# Patient Record
Sex: Female | Born: 1960 | Race: White | Hispanic: No | Marital: Married | Smoking: Never smoker
Health system: Southern US, Community
[De-identification: ages and names within clinical notes are randomized; demographics above are authoritative.]

## PROBLEM LIST (undated history)

## (undated) DIAGNOSIS — E785 Hyperlipidemia, unspecified: Secondary | ICD-10-CM

## (undated) DIAGNOSIS — I10 Essential (primary) hypertension: Secondary | ICD-10-CM

## (undated) DIAGNOSIS — M1990 Unspecified osteoarthritis, unspecified site: Secondary | ICD-10-CM

## (undated) DIAGNOSIS — T7840XA Allergy, unspecified, initial encounter: Secondary | ICD-10-CM

## (undated) DIAGNOSIS — E041 Nontoxic single thyroid nodule: Secondary | ICD-10-CM

## (undated) DIAGNOSIS — C801 Malignant (primary) neoplasm, unspecified: Secondary | ICD-10-CM

## (undated) DIAGNOSIS — R1084 Generalized abdominal pain: Secondary | ICD-10-CM

## (undated) HISTORY — PX: CYSTECTOMY: SUR359

## (undated) HISTORY — DX: Essential (primary) hypertension: I10

## (undated) HISTORY — DX: Hyperlipidemia, unspecified: E78.5

## (undated) HISTORY — DX: Nontoxic single thyroid nodule: E04.1

## (undated) HISTORY — DX: Allergy, unspecified, initial encounter: T78.40XA

## (undated) HISTORY — DX: Generalized abdominal pain: R10.84

## (undated) HISTORY — DX: Unspecified osteoarthritis, unspecified site: M19.90

## (undated) HISTORY — DX: Malignant (primary) neoplasm, unspecified: C80.1

---

## 1998-09-25 ENCOUNTER — Other Ambulatory Visit: Admission: RE | Admit: 1998-09-25 | Discharge: 1998-09-25 | Payer: Self-pay | Admitting: Family Medicine

## 1999-09-15 ENCOUNTER — Other Ambulatory Visit: Admission: RE | Admit: 1999-09-15 | Discharge: 1999-09-15 | Payer: Self-pay | Admitting: Family Medicine

## 1999-10-06 ENCOUNTER — Other Ambulatory Visit: Admission: RE | Admit: 1999-10-06 | Discharge: 1999-10-06 | Payer: Self-pay | Admitting: Obstetrics and Gynecology

## 1999-10-06 ENCOUNTER — Encounter (INDEPENDENT_AMBULATORY_CARE_PROVIDER_SITE_OTHER): Payer: Self-pay | Admitting: Specialist

## 2000-06-12 ENCOUNTER — Encounter: Admission: RE | Admit: 2000-06-12 | Discharge: 2000-06-12 | Payer: Self-pay | Admitting: Orthopedic Surgery

## 2000-06-12 ENCOUNTER — Encounter: Payer: Self-pay | Admitting: Orthopedic Surgery

## 2000-10-25 ENCOUNTER — Encounter: Payer: Self-pay | Admitting: Family Medicine

## 2000-10-25 ENCOUNTER — Encounter: Admission: RE | Admit: 2000-10-25 | Discharge: 2000-10-25 | Payer: Self-pay | Admitting: Family Medicine

## 2001-11-06 ENCOUNTER — Encounter: Payer: Self-pay | Admitting: Family Medicine

## 2001-11-06 ENCOUNTER — Encounter: Admission: RE | Admit: 2001-11-06 | Discharge: 2001-11-06 | Payer: Self-pay | Admitting: Family Medicine

## 2002-01-30 ENCOUNTER — Encounter: Payer: Self-pay | Admitting: Family Medicine

## 2002-01-30 ENCOUNTER — Encounter: Admission: RE | Admit: 2002-01-30 | Discharge: 2002-01-30 | Payer: Self-pay | Admitting: Family Medicine

## 2003-01-17 ENCOUNTER — Other Ambulatory Visit: Admission: RE | Admit: 2003-01-17 | Discharge: 2003-01-17 | Payer: Self-pay | Admitting: Family Medicine

## 2003-01-21 ENCOUNTER — Encounter: Admission: RE | Admit: 2003-01-21 | Discharge: 2003-01-21 | Payer: Self-pay | Admitting: Family Medicine

## 2005-11-15 ENCOUNTER — Other Ambulatory Visit: Admission: RE | Admit: 2005-11-15 | Discharge: 2005-11-15 | Payer: Self-pay | Admitting: Obstetrics and Gynecology

## 2006-01-17 HISTORY — PX: OTHER SURGICAL HISTORY: SHX169

## 2006-06-20 ENCOUNTER — Ambulatory Visit: Payer: Self-pay | Admitting: Internal Medicine

## 2006-06-20 DIAGNOSIS — L259 Unspecified contact dermatitis, unspecified cause: Secondary | ICD-10-CM | POA: Insufficient documentation

## 2006-07-04 ENCOUNTER — Ambulatory Visit: Payer: Self-pay | Admitting: Internal Medicine

## 2006-07-05 ENCOUNTER — Encounter: Payer: Self-pay | Admitting: Internal Medicine

## 2006-07-20 ENCOUNTER — Ambulatory Visit: Payer: Self-pay | Admitting: Internal Medicine

## 2006-07-20 DIAGNOSIS — I10 Essential (primary) hypertension: Secondary | ICD-10-CM | POA: Insufficient documentation

## 2006-08-01 ENCOUNTER — Encounter: Payer: Self-pay | Admitting: Internal Medicine

## 2006-08-28 ENCOUNTER — Encounter (INDEPENDENT_AMBULATORY_CARE_PROVIDER_SITE_OTHER): Payer: Self-pay | Admitting: *Deleted

## 2006-10-12 ENCOUNTER — Encounter (INDEPENDENT_AMBULATORY_CARE_PROVIDER_SITE_OTHER): Payer: Self-pay | Admitting: Obstetrics and Gynecology

## 2006-10-12 ENCOUNTER — Ambulatory Visit (HOSPITAL_COMMUNITY): Admission: RE | Admit: 2006-10-12 | Discharge: 2006-10-12 | Payer: Self-pay | Admitting: Obstetrics and Gynecology

## 2006-10-13 ENCOUNTER — Encounter: Payer: Self-pay | Admitting: Internal Medicine

## 2006-11-18 HISTORY — PX: CHOLECYSTECTOMY: SHX55

## 2006-11-23 ENCOUNTER — Encounter (INDEPENDENT_AMBULATORY_CARE_PROVIDER_SITE_OTHER): Payer: Self-pay | Admitting: Surgery

## 2006-11-23 ENCOUNTER — Ambulatory Visit (HOSPITAL_COMMUNITY): Admission: EM | Admit: 2006-11-23 | Discharge: 2006-11-24 | Payer: Self-pay | Admitting: Emergency Medicine

## 2006-12-13 ENCOUNTER — Ambulatory Visit: Payer: Self-pay | Admitting: Internal Medicine

## 2006-12-13 LAB — CONVERTED CEMR LAB
Bilirubin Urine: NEGATIVE
Glucose, Urine, Semiquant: NEGATIVE
Ketones, urine, test strip: NEGATIVE
Nitrite: NEGATIVE
RBC / HPF: NONE SEEN (ref <3)
Urobilinogen, UA: NEGATIVE
WBC, UA: NONE SEEN cells/hpf (ref <3)

## 2006-12-21 ENCOUNTER — Ambulatory Visit: Payer: Self-pay | Admitting: Internal Medicine

## 2006-12-25 LAB — CONVERTED CEMR LAB
Basophils Relative: 0.2 % (ref 0.0–1.0)
CO2: 28 meq/L (ref 19–32)
Creatinine, Ser: 0.8 mg/dL (ref 0.4–1.2)
Eosinophils Absolute: 0.3 10*3/uL (ref 0.0–0.6)
Eosinophils Relative: 3.9 % (ref 0.0–5.0)
Glucose, Bld: 87 mg/dL (ref 70–99)
HCT: 39.7 % (ref 36.0–46.0)
HDL: 47.7 mg/dL (ref >39.0)
Lymphocytes Relative: 35 % (ref 12.0–46.0)
MCHC: 33.5 g/dL (ref 30.0–36.0)
Monocytes Absolute: 0.5 10*3/uL (ref 0.2–0.7)
Neutro Abs: 3.4 10*3/uL (ref 1.4–7.7)
Neutrophils Relative %: 53.7 % (ref 43.0–77.0)
Platelets: 353 10*3/uL (ref 150–400)
Potassium: 4.6 meq/L (ref 3.5–5.1)
RBC: 4.47 M/uL (ref 3.87–5.11)
RDW: 13.6 % (ref 11.5–14.6)
Sodium: 136 meq/L (ref 135–145)

## 2007-01-07 ENCOUNTER — Encounter: Payer: Self-pay | Admitting: Internal Medicine

## 2007-03-05 ENCOUNTER — Ambulatory Visit: Payer: Self-pay | Admitting: Internal Medicine

## 2007-03-08 ENCOUNTER — Telehealth (INDEPENDENT_AMBULATORY_CARE_PROVIDER_SITE_OTHER): Payer: Self-pay | Admitting: *Deleted

## 2007-04-11 ENCOUNTER — Telehealth (INDEPENDENT_AMBULATORY_CARE_PROVIDER_SITE_OTHER): Payer: Self-pay | Admitting: *Deleted

## 2007-07-13 ENCOUNTER — Ambulatory Visit: Payer: Self-pay | Admitting: Internal Medicine

## 2007-08-02 ENCOUNTER — Encounter: Payer: Self-pay | Admitting: Internal Medicine

## 2007-08-16 ENCOUNTER — Other Ambulatory Visit: Admission: RE | Admit: 2007-08-16 | Discharge: 2007-08-16 | Payer: Self-pay | Admitting: Obstetrics and Gynecology

## 2007-08-30 ENCOUNTER — Encounter (INDEPENDENT_AMBULATORY_CARE_PROVIDER_SITE_OTHER): Payer: Self-pay | Admitting: *Deleted

## 2007-09-18 LAB — CONVERTED CEMR LAB: Pap Smear: NORMAL

## 2007-11-13 ENCOUNTER — Ambulatory Visit: Payer: Self-pay | Admitting: Internal Medicine

## 2007-11-13 DIAGNOSIS — E785 Hyperlipidemia, unspecified: Secondary | ICD-10-CM

## 2007-11-13 DIAGNOSIS — R109 Unspecified abdominal pain: Secondary | ICD-10-CM | POA: Insufficient documentation

## 2007-11-13 LAB — CONVERTED CEMR LAB
Glucose, Urine, Semiquant: NEGATIVE
Urobilinogen, UA: 0.2

## 2007-11-14 ENCOUNTER — Encounter: Payer: Self-pay | Admitting: Internal Medicine

## 2007-11-14 ENCOUNTER — Encounter (INDEPENDENT_AMBULATORY_CARE_PROVIDER_SITE_OTHER): Payer: Self-pay | Admitting: *Deleted

## 2007-11-14 LAB — CONVERTED CEMR LAB

## 2007-11-16 ENCOUNTER — Telehealth (INDEPENDENT_AMBULATORY_CARE_PROVIDER_SITE_OTHER): Payer: Self-pay | Admitting: *Deleted

## 2007-11-20 ENCOUNTER — Encounter (INDEPENDENT_AMBULATORY_CARE_PROVIDER_SITE_OTHER): Payer: Self-pay | Admitting: *Deleted

## 2007-11-20 LAB — CONVERTED CEMR LAB
Albumin: 4 g/dL (ref 3.5–5.2)
Alkaline Phosphatase: 62 units/L (ref 39–117)
Basophils Relative: 0.7 % (ref 0.0–3.0)
Bilirubin, Direct: 0.1 mg/dL (ref 0.0–0.3)
Eosinophils Relative: 2.9 % (ref 0.0–5.0)
HCT: 39.7 % (ref 36.0–46.0)
Hemoglobin: 13.7 g/dL (ref 12.0–15.0)
Lymphocytes Relative: 32.8 % (ref 12.0–46.0)
Neutro Abs: 4.7 10*3/uL (ref 1.4–7.7)
Neutrophils Relative %: 57 % (ref 43.0–77.0)
Platelets: 293 10*3/uL (ref 150–400)
RBC: 4.38 M/uL (ref 3.87–5.11)
Total Bilirubin: 0.7 mg/dL (ref 0.3–1.2)
Total CHOL/HDL Ratio: 5.6
Triglycerides: 208 mg/dL (ref 0–149)

## 2007-12-05 ENCOUNTER — Ambulatory Visit: Payer: Self-pay | Admitting: Gastroenterology

## 2007-12-06 ENCOUNTER — Telehealth: Payer: Self-pay | Admitting: Gastroenterology

## 2008-04-11 ENCOUNTER — Ambulatory Visit: Payer: Self-pay | Admitting: Gastroenterology

## 2008-04-17 ENCOUNTER — Telehealth: Payer: Self-pay | Admitting: Internal Medicine

## 2008-04-17 ENCOUNTER — Telehealth: Payer: Self-pay | Admitting: Gastroenterology

## 2008-04-25 ENCOUNTER — Ambulatory Visit: Payer: Self-pay | Admitting: Gastroenterology

## 2008-04-25 LAB — HM COLONOSCOPY

## 2008-05-12 ENCOUNTER — Ambulatory Visit: Payer: Self-pay | Admitting: Internal Medicine

## 2008-05-15 ENCOUNTER — Encounter: Admission: RE | Admit: 2008-05-15 | Discharge: 2008-05-15 | Payer: Self-pay | Admitting: Internal Medicine

## 2008-05-20 ENCOUNTER — Telehealth (INDEPENDENT_AMBULATORY_CARE_PROVIDER_SITE_OTHER): Payer: Self-pay | Admitting: *Deleted

## 2008-08-04 ENCOUNTER — Encounter: Payer: Self-pay | Admitting: Internal Medicine

## 2008-08-13 ENCOUNTER — Encounter: Payer: Self-pay | Admitting: Internal Medicine

## 2008-09-03 ENCOUNTER — Encounter (INDEPENDENT_AMBULATORY_CARE_PROVIDER_SITE_OTHER): Payer: Self-pay | Admitting: *Deleted

## 2008-11-03 ENCOUNTER — Telehealth (INDEPENDENT_AMBULATORY_CARE_PROVIDER_SITE_OTHER): Payer: Self-pay | Admitting: *Deleted

## 2009-01-12 ENCOUNTER — Ambulatory Visit: Payer: Self-pay | Admitting: Internal Medicine

## 2009-01-22 ENCOUNTER — Ambulatory Visit: Payer: Self-pay | Admitting: Internal Medicine

## 2009-01-22 LAB — CONVERTED CEMR LAB: Vit D, 25-Hydroxy: 34 ng/mL (ref 30–89)

## 2009-01-26 LAB — CONVERTED CEMR LAB
ALT: 29 units/L (ref 0–35)
Calcium: 9.1 mg/dL (ref 8.4–10.5)
Creatinine, Ser: 0.7 mg/dL (ref 0.4–1.2)
Eosinophils Absolute: 0.3 10*3/uL (ref 0.0–0.7)
Eosinophils Relative: 4.3 % (ref 0.0–5.0)
HCT: 42.5 % (ref 36.0–46.0)
Hemoglobin: 14 g/dL (ref 12.0–15.0)
Lymphocytes Relative: 40.8 % (ref 12.0–46.0)
Lymphs Abs: 2.8 10*3/uL (ref 0.7–4.0)
MCHC: 33 g/dL (ref 30.0–36.0)
MCV: 92.5 fL (ref 78.0–100.0)
Monocytes Absolute: 0.4 10*3/uL (ref 0.1–1.0)
RBC: 4.59 M/uL (ref 3.87–5.11)
RDW: 12.7 % (ref 11.5–14.6)
TSH: 1.53 microintl units/mL (ref 0.35–5.50)
WBC: 6.8 10*3/uL (ref 4.5–10.5)

## 2009-02-12 ENCOUNTER — Telehealth: Payer: Self-pay | Admitting: Internal Medicine

## 2009-03-23 ENCOUNTER — Ambulatory Visit: Payer: Self-pay | Admitting: Internal Medicine

## 2009-03-27 ENCOUNTER — Telehealth: Payer: Self-pay | Admitting: Internal Medicine

## 2009-03-27 LAB — CONVERTED CEMR LAB
ALT: 24 U/L
AST: 21 U/L
Cholesterol: 232 mg/dL — ABNORMAL HIGH
Direct LDL: 155.1 mg/dL
HDL: 46.5 mg/dL
Total CHOL/HDL Ratio: 5
Triglycerides: 222 mg/dL — ABNORMAL HIGH
VLDL: 44.4 mg/dL — ABNORMAL HIGH

## 2009-04-20 IMAGING — CR DG CHEST 1V PORT
1 series · 1 of 1 positions shown · non-contrast
Comparison: none

CLINICAL DATA: Abdominal pain. 
 PORTABLE CHEST - 1 VIEW:

[view not recorded]
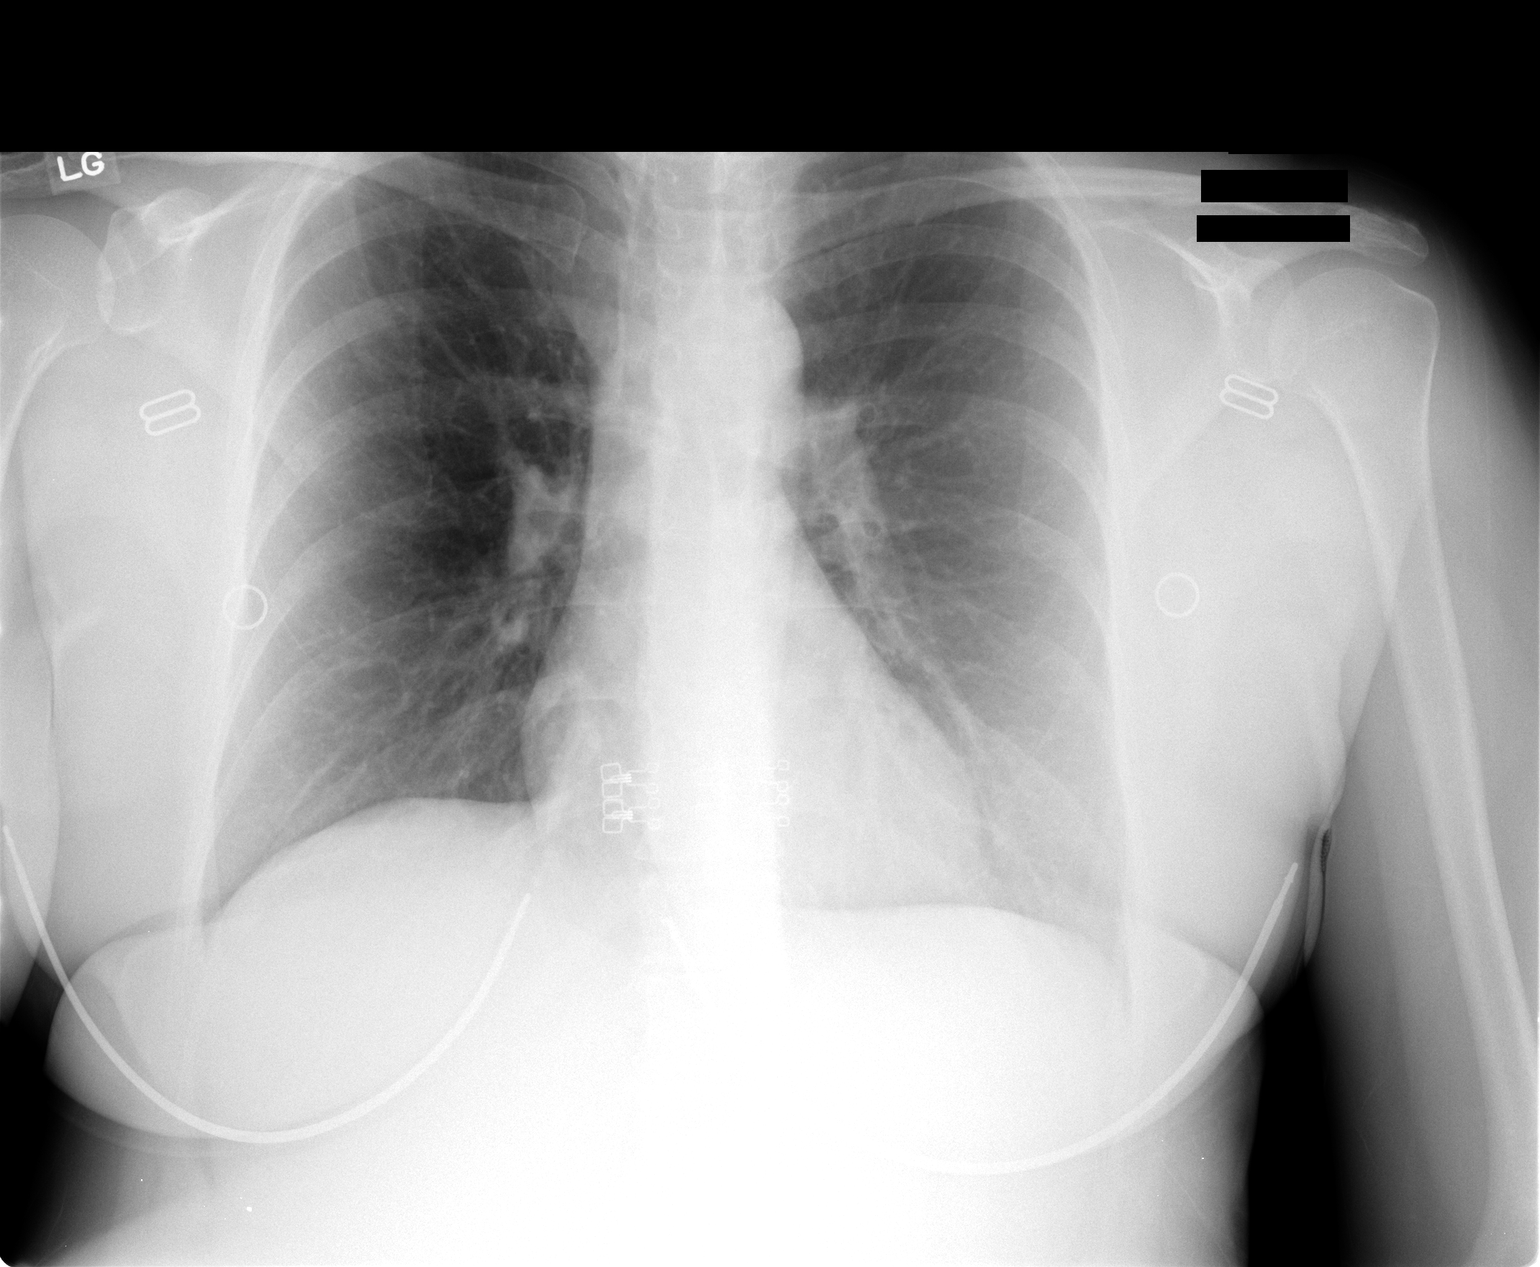

[1 of 1 positions shown; findings below may reference images not displayed]

FINDINGS: Cardiomediastinal silhouette is unremarkable.  Mild peribronchial thickening noted without focal air space disease, pleural effusions, or pneumothorax.  Bony thorax is within normal limits.
IMPRESSION: No evidence of acute cardiopulmonary disease.

## 2009-04-20 IMAGING — US US ABDOMEN COMPLETE
1 series · 13 of 25 positions shown · non-contrast
Comparison: None.

ABDOMEN ULTRASOUND:

CLINICAL DATA: Right upper quadrant pain. Leukocytosis.
TECHNIQUE: Complete abdominal ultrasound examination was performed including
evaluation of the liver, gallbladder, bile ducts, pancreas, kidneys, spleen,
IVC, and abdominal aorta.

[Series 1: unknown · 0.30mm/px · 13 of 75 slices shown]
[im 1/75]
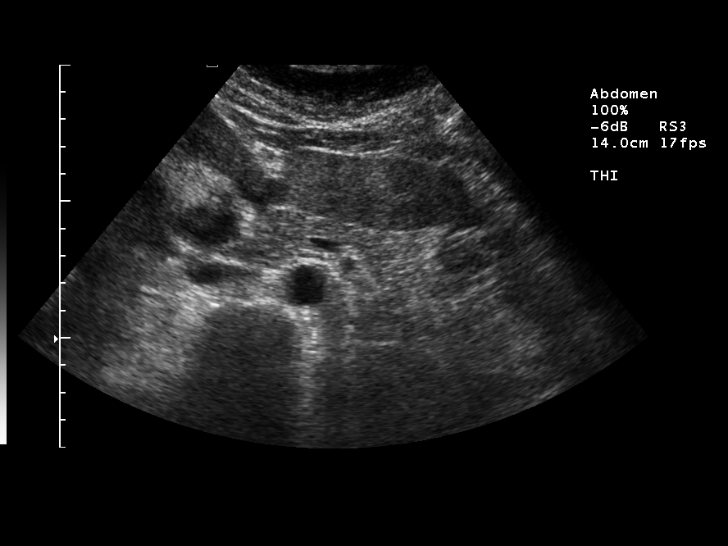
[im 7/75]
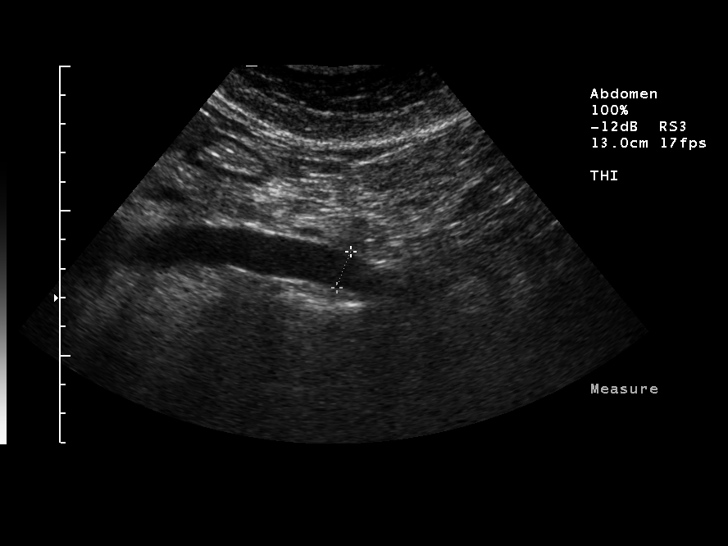
[im 13/75]
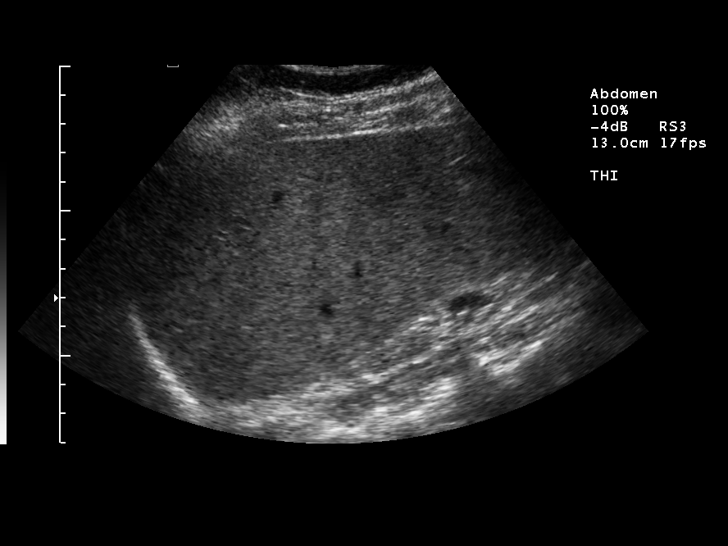
[im 19/75]
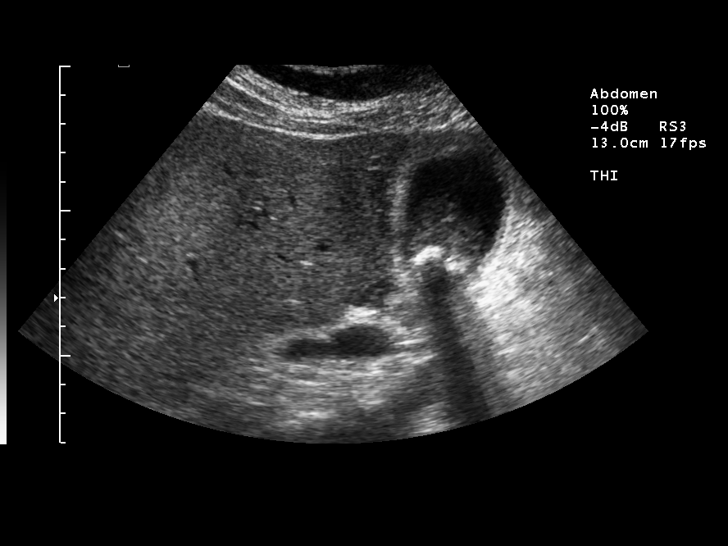
[im 25/75]
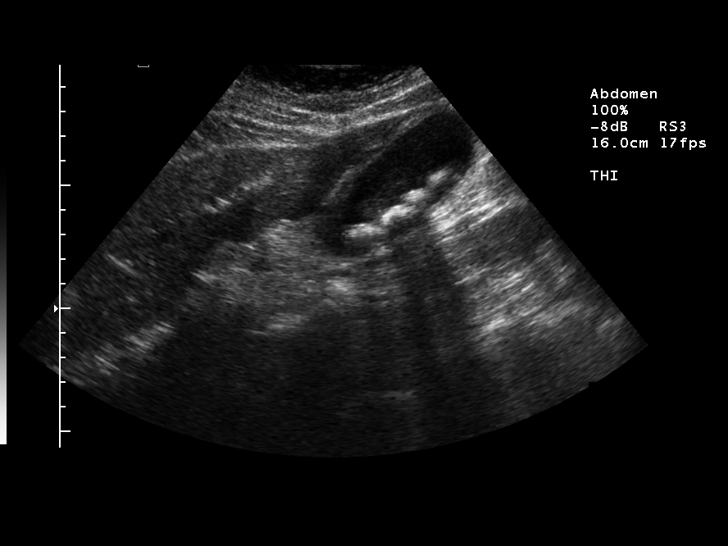
[im 31/75]
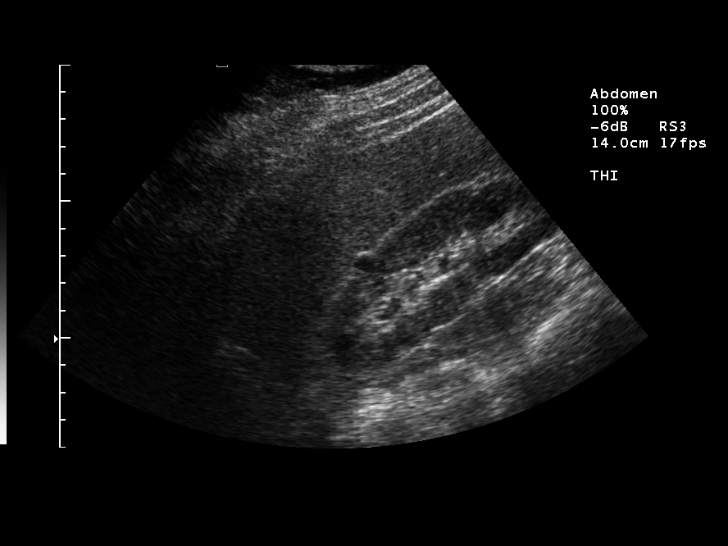
[im 38/75]
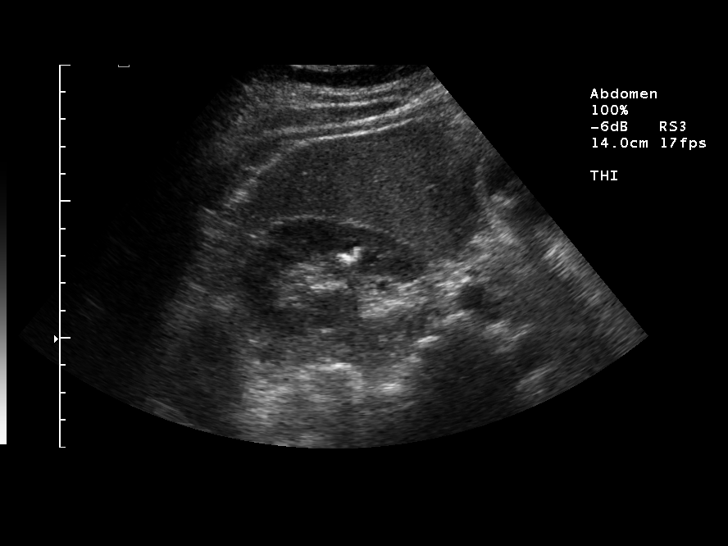
[im 44/75]
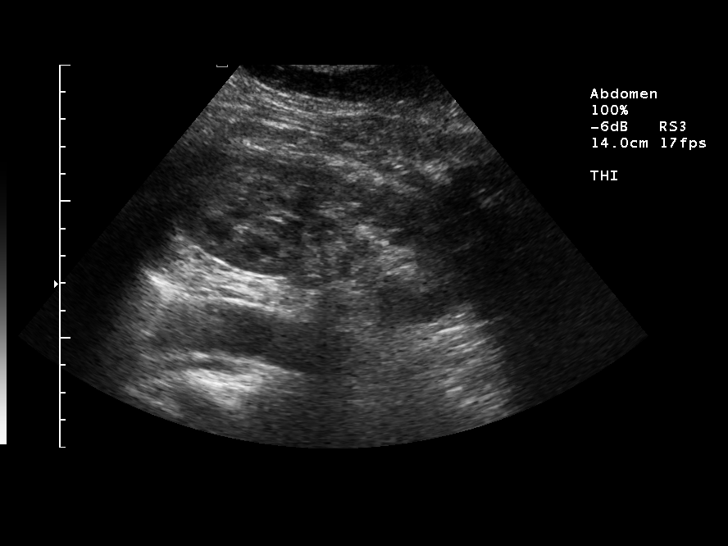
[im 50/75]
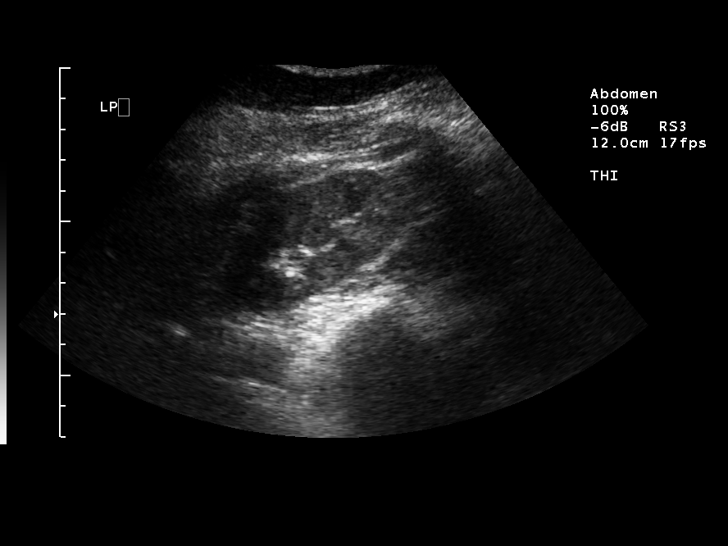
[im 56/75]
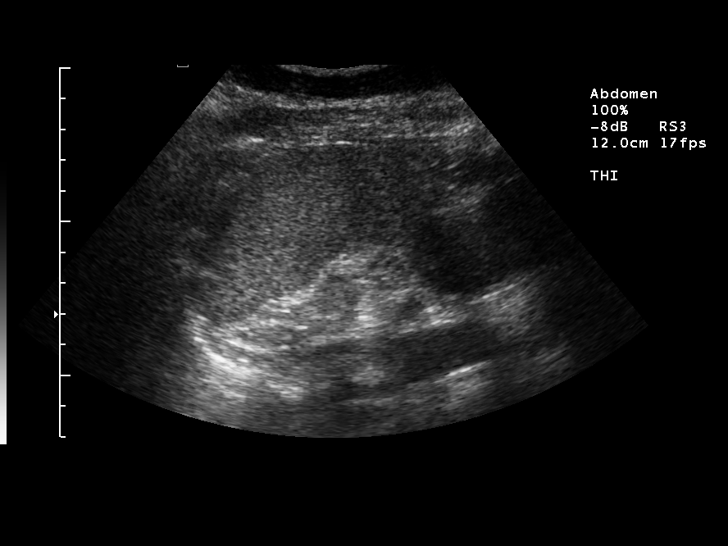
[im 62/75]
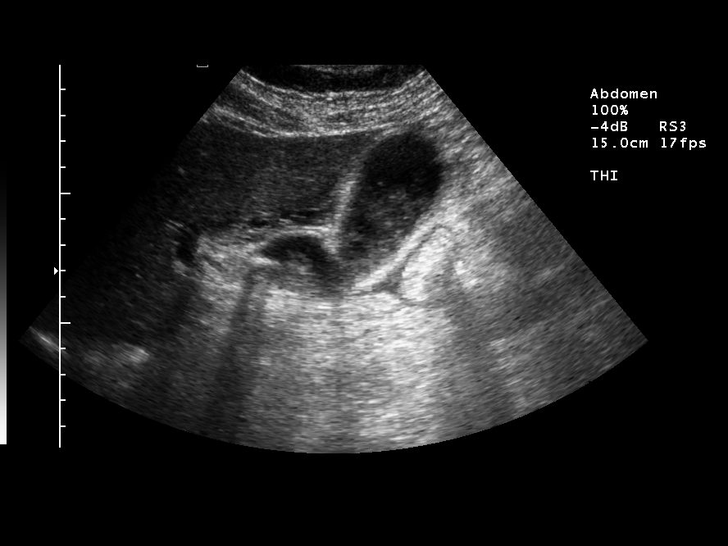
[im 68/75]
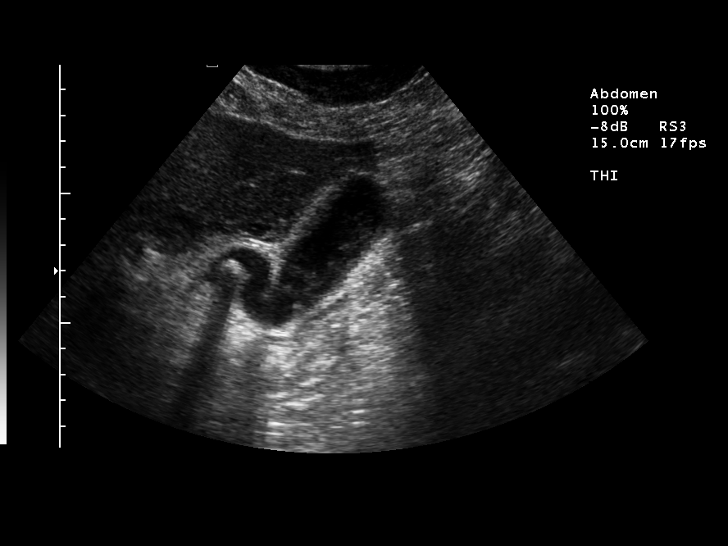
[im 75/75]
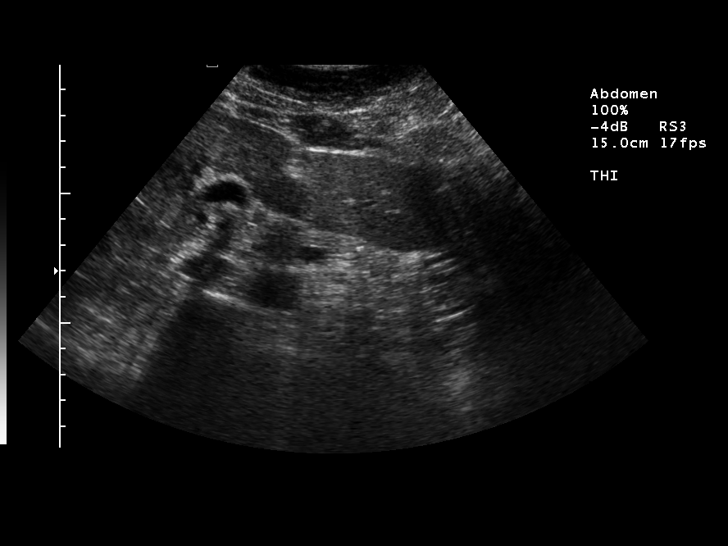

[13 of 25 positions shown; findings below may reference images not displayed]

FINDINGS: Gallbladder:        Multiple calcified stones are seen within the gallbladder
lumen. The gallbladder wall is thickened, measuring 4-5 mm in thickness. There
is a small amount of pericholecystic fluid around the neck the gallbladder. A
stone is identified in the neck of the gallbladder, possibly in the cystic duct.
The patient does have right upper quadrant tenderness pleurodesis technologist
although, apparently, the patient received premedication in the emergency
department before this exam which would lessen the sensitivity for a sonographic
Murphy sign.

Common Bile Duct:   Non-dilated 5 X 6 mm in diameter.

Liver:   Unremarkable. No intrahepatic biliary dilatation.

Inferior Vena Cava:  Normal

Pancreas:  Normal

Spleen:  Normal

Right Kidney:   10.9 cm in long axis.    7 mm echogenic area in the interpolar
region may be a nonobstructing stone. A 7 mm anechoic cortical lesion is
probably a tiny cyst.

Left Kidney:   11.8 cm in long axis.   Question 6 mm nonobstructing stone.

Aorta:  No aneurysm
IMPRESSION: Multiple gallstones with gallbladder wall thickening and pericholecystic
fluid. Imaging features are suspicious for acute cholecystitis. Close clinical
correlation is recommended. There is a stone in the neck of the gallbladder,
possibly just into the cystic duct.

## 2009-07-17 LAB — HM PAP SMEAR: HM Pap smear: NORMAL

## 2009-07-28 ENCOUNTER — Ambulatory Visit: Payer: Self-pay | Admitting: Internal Medicine

## 2009-08-06 ENCOUNTER — Encounter (INDEPENDENT_AMBULATORY_CARE_PROVIDER_SITE_OTHER): Payer: Self-pay | Admitting: *Deleted

## 2009-08-07 LAB — CONVERTED CEMR LAB
Cholesterol: 143 mg/dL (ref 0–200)
Triglycerides: 133 mg/dL (ref 0.0–149.0)
VLDL: 26.6 mg/dL (ref 0.0–40.0)

## 2009-08-10 ENCOUNTER — Encounter: Payer: Self-pay | Admitting: Internal Medicine

## 2009-09-07 ENCOUNTER — Other Ambulatory Visit: Admission: RE | Admit: 2009-09-07 | Discharge: 2009-09-07 | Payer: Self-pay | Admitting: Obstetrics and Gynecology

## 2010-02-16 NOTE — Letter (Signed)
Summary: Unable To Reach-Consult Scheduled  [facility]  [address] [place], [place] [address]   Phone: [phone]  Fax: [phone]    08/06/2009 MRN: [id number]    Dear Ms. Altemose,   We have been unable to reach you by phone.  Please contact our office with an updated phone number.      Thank you,  Army Fossa CMA  August 06, 2009 11:10 AM

## 2010-02-16 NOTE — Progress Notes (Signed)
Summary: ?wt  Phone Note Call from Patient Call back at Home Phone [phone]   Details for Reason: PATIENT LEFT MESSAGE ON VM: Summary of Call: She has started a TOPPS program (wt loss).  Needs to have written documentation of her goal wt that her MD recommends. Initial call taken by: Shary Decamp,  February 12, 2009 1:27 PM  Follow-up for Phone Call        her BM is not too high (26.5) , if she drops to 130 pounds gradually (over 8 months at least) she will be much better w/  calculated BMI of aprox 24 to 25 Cheyenne E. Paz MD  February 12, 2009 5:20 PM   copy up front for pt to pick up [facility]  February 13, 2009 10:35 AM

## 2010-02-16 NOTE — Progress Notes (Signed)
Summary: never started  lipitor 20--- start  Phone Note Outgoing Call   Call placed by: Doristine Devoid,  March 27, 2009 8:25 AM Call placed to: Patient Summary of Call: advise patient: chol is essentially unchanged , rec increase lipitor to 40 FLP AST ALT 2 months  Follow-up for Phone Call        left message on machine ....Marland KitchenMarland KitchenDoristine Devoid  March 27, 2009 8:25 AM  discussed labs with pt -- pt never took the lipitor 20mg , new rx for 20mg  called to pharmacy -- pt advised to recheck labs in 2 months Shary Decamp  April 02, 2009 11:45 AM     New/Updated Medications: LIPITOR 40 MG TABS (ATORVASTATIN CALCIUM) take one tablet at bedtime LIPITOR 20 MG TABS (ATORVASTATIN CALCIUM) 1 by mouth at bedtime - [facility] 5/11 Prescriptions: LIPITOR 20 MG TABS (ATORVASTATIN CALCIUM) 1 by mouth at bedtime - [facility] 5/11  #30 x 2   Entered by:   Shary Decamp   Authorized by:   Nolon Rod. Timera Windt MD   Signed by:   Shary Decamp on 04/02/2009   Method used:   Electronically to        [id number] (retail)       [address]       [place], [place]  [address]       Ph: [phone]       Fax: [phone]   RxID:   [id number]

## 2010-04-12 ENCOUNTER — Telehealth: Payer: Self-pay | Admitting: Internal Medicine

## 2010-04-12 MED ORDER — METOPROLOL TARTRATE 25 MG PO TABS: 25.0000 mg | ORAL_TABLET | Freq: Two times a day (BID) | ORAL | Status: DC

## 2010-04-12 NOTE — Telephone Encounter (Signed)
Please call the patient, okay to call one month supply, no further refills without a office visit, last office visit was in December 2010.

## 2010-04-12 NOTE — Telephone Encounter (Signed)
Left message for pt to call back- due for appt.

## 2010-04-12 NOTE — Telephone Encounter (Signed)
Spoke w/ pt aware of medication and sent to pharmacy

## 2010-05-13 ENCOUNTER — Encounter: Payer: Self-pay | Admitting: Internal Medicine

## 2010-05-14 ENCOUNTER — Ambulatory Visit (INDEPENDENT_AMBULATORY_CARE_PROVIDER_SITE_OTHER): Payer: BC Managed Care – PPO | Admitting: Internal Medicine

## 2010-05-14 ENCOUNTER — Encounter: Payer: Self-pay | Admitting: Internal Medicine

## 2010-05-14 DIAGNOSIS — Z0000 Encounter for general adult medical examination without abnormal findings: Secondary | ICD-10-CM | POA: Insufficient documentation

## 2010-05-14 DIAGNOSIS — E785 Hyperlipidemia, unspecified: Secondary | ICD-10-CM

## 2010-05-14 DIAGNOSIS — M722 Plantar fascial fibromatosis: Secondary | ICD-10-CM | POA: Insufficient documentation

## 2010-05-14 DIAGNOSIS — K589 Irritable bowel syndrome without diarrhea: Secondary | ICD-10-CM | POA: Insufficient documentation

## 2010-05-14 DIAGNOSIS — I10 Essential (primary) hypertension: Secondary | ICD-10-CM

## 2010-05-14 MED ORDER — DICLOFENAC SODIUM 1 % TD GEL: 1.0000 "application " | Freq: Three times a day (TID) | TRANSDERMAL | Status: DC

## 2010-05-14 NOTE — Progress Notes (Signed)
  Subjective:    Patient ID: Cheyenne Bullock, female    DOB: [birth year], 51 y.o.   MRN: [id number]  HPI CPX Last of his visit December 2010. She has lists of concerns -plantar fasceitis: local NSAIDs? -Complaining of difficulty sleeping, wakes up a lot, often times wakes up with tingling in all her fingertips. -She has a history of IBS, she had regular bowel movements daily however she developed pain in the right lower quadrant and epigastric pain after every bowel movement. Pain is described as a hurt, not crampy, usually last several hours. -Also concerned about central obesity, wonders about a "syndrome W." request a insulin level -Also she is unable to lose weight despite a reasonable diet Additionally, we need to check her chronic or medical problems. -High cholesterol, good medication compliance. -Hypertension, good medication compliance, normal ambulatory blood pressures.  Review of Systems No nausea, vomiting, diarrhea. No blood in the stools Denies chest pain, shortness of breath or lower extremity edema Admits to decreased libido and vaginal dryness (asked patient to discuss with gynecology)    Objective:   Physical Exam  Constitutional: She is oriented to person, place, and time. She appears well-developed and well-nourished. No distress.  HENT:  Head: Normocephalic and atraumatic.  Eyes: No scleral icterus.  Neck: No thyromegaly present.       Normal carotid pulses  Cardiovascular: Normal rate, regular rhythm and normal heart sounds.   No murmur heard. Pulmonary/Chest: Effort normal and breath sounds normal. No respiratory distress. She has no wheezes. She has no rales.  Abdominal: Soft. Bowel sounds are normal. She exhibits no distension and no mass. There is no tenderness. There is no rebound and no guarding.       No organomegaly  Musculoskeletal: She exhibits no edema.  Neurological: She is alert and oriented to person, place, and time. No cranial nerve deficit.  Coordination normal.  Skin: Skin is warm and dry.  Psychiatric: She has a normal mood and affect. Her behavior is normal. Judgment and thought content normal.          Assessment & Plan:

## 2010-05-14 NOTE — Assessment & Plan Note (Addendum)
Td 12-2008 sees  Gynecology regularly normal colonoscopy April 2010 Plan exercise discussed Paresthesias, we'll check a B12 and folic acid.

## 2010-05-14 NOTE — Patient Instructions (Signed)
Please come back fasting FLP-- hyperlipidemia BMP--- dx HTN CBC, TSH, AST, ALT, insulin level---dx V70 B12-folic acid--- dx paresthesias

## 2010-05-14 NOTE — Assessment & Plan Note (Signed)
Well-controlled, EKG within normal limits. RF medicines when needed

## 2010-05-14 NOTE — Assessment & Plan Note (Signed)
Long-standing problem per patient, has seemed or phobia for broken injections. In addition to stretching, I am prescribing Voltaren gel

## 2010-05-14 NOTE — Assessment & Plan Note (Signed)
Labs

## 2010-05-14 NOTE — Assessment & Plan Note (Signed)
Chronic symptoms, recommend to discuss with GI

## 2010-05-17 ENCOUNTER — Other Ambulatory Visit (INDEPENDENT_AMBULATORY_CARE_PROVIDER_SITE_OTHER): Payer: BC Managed Care – PPO

## 2010-05-17 ENCOUNTER — Other Ambulatory Visit: Payer: Self-pay | Admitting: Internal Medicine

## 2010-05-17 DIAGNOSIS — I10 Essential (primary) hypertension: Secondary | ICD-10-CM

## 2010-05-17 DIAGNOSIS — R209 Unspecified disturbances of skin sensation: Secondary | ICD-10-CM

## 2010-05-17 DIAGNOSIS — Z0000 Encounter for general adult medical examination without abnormal findings: Secondary | ICD-10-CM

## 2010-05-17 LAB — CBC WITH DIFFERENTIAL/PLATELET
Basophils Absolute: 0 10*3/uL (ref 0.0–0.1)
Basophils Relative: 0.2 % (ref 0.0–3.0)
Eosinophils Absolute: 0.2 10*3/uL (ref 0.0–0.7)
HCT: 39.2 % (ref 36.0–46.0)
Hemoglobin: 13.5 g/dL (ref 12.0–15.0)
MCHC: 34.4 g/dL (ref 30.0–36.0)
Monocytes Absolute: 0.4 10*3/uL (ref 0.1–1.0)
Monocytes Relative: 6.9 % (ref 3.0–12.0)
RBC: 4.35 Mil/uL (ref 3.87–5.11)
RDW: 13.6 % (ref 11.5–14.6)

## 2010-05-17 LAB — AST: AST: 19 U/L (ref 0–37)

## 2010-05-17 LAB — ALT: ALT: 22 U/L (ref 0–35)

## 2010-05-17 LAB — BASIC METABOLIC PANEL
Creatinine, Ser: 0.9 mg/dL (ref 0.4–1.2)
Potassium: 4.1 mEq/L (ref 3.5–5.1)
Sodium: 136 mEq/L (ref 135–145)

## 2010-05-17 LAB — TSH: TSH: 1.42 u[IU]/mL (ref 0.35–5.50)

## 2010-05-17 LAB — FOLATE: Folate: >24.8 ng/mL (ref >5.9)

## 2010-05-19 ENCOUNTER — Telehealth: Payer: Self-pay | Admitting: *Deleted

## 2010-05-19 LAB — LIPID PANEL
Triglycerides: 256 mg/dL — ABNORMAL HIGH (ref 0.0–149.0)
VLDL: 51.2 mg/dL — ABNORMAL HIGH (ref 0.0–40.0)

## 2010-05-19 LAB — LDL CHOLESTEROL, DIRECT: Direct LDL: 161.3 mg/dL

## 2010-05-19 NOTE — Telephone Encounter (Signed)
Message copied by Army Fossa on Wed May 19, 2010  9:41 AM ------      Message from: Cheyenne Bullock      Created: Tue May 18, 2010  5:39 PM       Advised patient, all her blood work is completely normal including her insulin levels.      Please see instructions from the OV, she was supposed to have FLP. Please be assured that is done

## 2010-05-19 NOTE — Telephone Encounter (Signed)
Message left for patient to return my call.  

## 2010-05-20 ENCOUNTER — Telehealth: Payer: Self-pay | Admitting: *Deleted

## 2010-05-20 NOTE — Telephone Encounter (Signed)
Message copied by Army Fossa on Thu May 20, 2010  9:45 AM ------      Message from: Willow Ora      Created: Thu May 20, 2010  9:06 AM       Advise patient:      His cholesterol used to be a lot better.      If she has been taking Lipitor 20 mg daily, increased to 40 mg daily.      If she has not been taking her Lipitor consistently I recommended her to do so.      Please arrange for a office visit in 4 months

## 2010-05-20 NOTE — Telephone Encounter (Signed)
Message left for patient to return my call.  

## 2010-05-21 NOTE — Telephone Encounter (Signed)
Message left for patient to return my call.  

## 2010-05-24 NOTE — Telephone Encounter (Signed)
Message left for patient to return my call.  

## 2010-05-25 ENCOUNTER — Encounter: Payer: Self-pay | Admitting: *Deleted

## 2010-05-25 NOTE — Telephone Encounter (Signed)
Will mail

## 2010-05-25 NOTE — Telephone Encounter (Signed)
Unable to reach pt, would you like for me to just mail- unable to find out about her liptor?

## 2010-05-25 NOTE — Telephone Encounter (Signed)
Send labs, my notes and a note asking about lipitor dose

## 2010-05-25 NOTE — Telephone Encounter (Signed)
Mailed letter °

## 2010-05-29 ENCOUNTER — Other Ambulatory Visit: Payer: Self-pay | Admitting: Internal Medicine

## 2010-05-31 ENCOUNTER — Telehealth: Payer: Self-pay | Admitting: *Deleted

## 2010-05-31 NOTE — Telephone Encounter (Signed)
Please ask them for any suggestions

## 2010-05-31 NOTE — Telephone Encounter (Signed)
[facility] sent a fax stating that pts Voltarten is on backorder with no release date. Please advise on an alt med.

## 2010-06-01 NOTE — Telephone Encounter (Signed)
Only recommendation for a Gel type is Pennsaid-- need directions.

## 2010-06-01 NOTE — Op Note (Signed)
[name], Cheyenne Bullock              ACCOUNT NO.:  [id number]   MEDICAL RECORD NO.:  [id number]          PATIENT TYPE:  AMB   LOCATION:  SDC                           FACILITY:  [facility]   PHYSICIAN:  Charles A. Delcambre, MDDATE OF BIRTH:  [birth year]   DATE OF PROCEDURE:  10/12/2006  DATE OF DISCHARGE:                               OPERATIVE REPORT   PREOPERATIVE DIAGNOSIS:  1. Menorrhagia.  2. Cervical polyp.   POSTOPERATIVE DIAGNOSIS:  1. Menorrhagia.  2. Cervical polyp.   PROCEDURE:  ThermaChoice endometrial ablation done after dilation and  curettage for endometrial sampling and LEEP of cervical polyp.   SURGEON:  Charles A. Sydnee Cabal, M.D.   ASSISTANT:  None.   COMPLICATIONS:  None.   COMPLICATIONS:  None.   ESTIMATED BLOOD LOSS:  Less than 25 mL.   ANESTHESIA:  General by LMA.   OPERATIVE FINDINGS:  Sounded to 10 cm.   SPECIMENS:  Endometrial curettings to pathology, cervical polyp to  pathology.   COMPLICATIONS:  None.   DESCRIPTION OF PROCEDURE:  The patient was taken to the operating room  and placed in the supine position.  Anesthesia was induced without  difficulty.  She was then placed in the dorsal lithotomy position in  universal stirrups.  Sterile prep and drape was undertaken.  The  anterior lip of the cervix was grasped with a single tooth tenaculum  with a weighted speculum in the vagina and sound was to 10 cm. Dilation,  very slightly, enough to pass a serrated curet was done.  Circumferential curettings were obtained with the serrated curet.  There  was no evidence of perforation.  A small amount of tissue was obtained.  Attention was then turned to the ThermaChoice.  The ThermaChoice was  primed and prepped per protocol and brought to minus 180 mmHg and then  inserted and pressures were stabilized at 175 mmHg. With the pressure  stable, no evidence of perforation, the heating cycle was begun. Heating  up to 87 degrees was accomplished without  difficulty and then the eight  minute therapy cycle was accomplished without complication.  The  ThermaChoice was removed and attention was given to the LEEP.  The LEEP  was done to remove the cervical polyp which was sessile in nature at the  6 to 7 o'clock.  This was done under gross visualization.  The  colposcope was not used because we were not dealing with an abnormal Pap  lesion but rather taking a sessile polyp off of the cervix.  Cutting at  50 watts was used, coag at 50 watts was used, and hemostasis was good.  Upon removal of the speculum, there was some bleeding noted.  Again,  further cautery was done carefully and Monsel's was placed.  Hemostasis  was excellent at the termination of the procedure.  The patient was  awakened and taken to recovery with the physician in attendance, having  tolerated procedure well.      Charles A. Sydnee Cabal, MD  Electronically Signed     CAD/MEDQ  D:  10/12/2006  T:  10/12/2006  Job:  [id number]

## 2010-06-01 NOTE — H&P (Signed)
[name], Cheyenne Bullock              ACCOUNT NO.:  [id number]   MEDICAL RECORD NO.:  [id number]          PATIENT TYPE:  AMB   LOCATION:  SDC                           FACILITY:  [facility]   PHYSICIAN:  Charles A. Delcambre, MDDATE OF BIRTH:  1960-12-11   DATE OF ADMISSION:  DATE OF DISCHARGE:                              HISTORY & PHYSICAL   ADDENDUM:  This patient was admitted tomorrow to undergo LEEP area on  her cervical polyp at 6 o'clock with a sessile base and wide base. She  did not tolerate this in the office. She is also having irregular  bleeding and dysmenorrhea. Pain increases the day or two prior to her  period. This gets somewhat better. She has not had STDs. She has a  period every 21 days, bleeds for about 7 days, 2-day passage of large  clots. Hysterogram was done and was normal. Endometrial thickness was 6  mm. Endometrial biopsy August 12, 2003 was proliferative endometrium. She  desires ablation at this time. We will add the ablation, as she has not  been sampled preoperatively since 2005, we will go ahead and get a  slight D&C specimen with a serrated curet prior to ablation at the  hospital. All questions were answered. She gives informed consent.  Accepts risk of perforation, failed ablation, fluid imbalance, failure  of 10%, eumenorrhea approximately 80-90% and amenorrhea approximately 30-  40% after ThermaChoice ablation. She gives informed consent. She  understands that 1 month of watery discharge after the procedure and  will follow up as directed. We will reschedule the surgery until next  week as she is on her period time to decrease infection risk with the  cervical LEEP.      Charles A. Sydnee Cabal, MD  Electronically Signed     CAD/MEDQ  D:  09/27/2006  T:  09/28/2006  Job:  [id number]

## 2010-06-01 NOTE — Op Note (Signed)
NAMEDAURICE, OVANDO              ACCOUNT NO.:  [id number]   MEDICAL RECORD NO.:  [id number]          PATIENT TYPE:  INP   LOCATION:  1540                         FACILITY:  [facility]   PHYSICIAN:  Thornton Park. Daphine Deutscher, MD  DATE OF BIRTH:  10-03-1960   DATE OF PROCEDURE:  11/23/2006  DATE OF DISCHARGE:                               OPERATIVE REPORT   PREOPERATIVE DIAGNOSIS:  Acute cholecystitis.   POSTOPERATIVE DIAGNOSIS:  Acute cholecystitis with normal intraoperative  cholangiogram.   SURGEON:  Thornton Park. Daphine Deutscher, M.D.   ASSISTANT:  None.   ANESTHESIA:  General endotracheal.   DESCRIPTION OF PROCEDURE:  Kafi Dotter is a 50 year old lady brought  to room 6 on Thursday afternoon, November 23, 2006, and given general  anesthesia.  The abdomen was prepped with Techni-Care and draped  sterilely.   I entered the abdomen through the umbilicus with a longitudinal incision  without difficulty.  Standard trocars were placed in the upper abdomen,  but her gallbladder was really distended and pushed to the midline, and  I placed the upper trocar and had a small superficial laceration of the  left lateral segment which I controlled with electrocautery and which  stopped at that point.  I then grasped the gallbladder and elevated it.  It was swollen,  striated, edematous, and acutely inflamed.  I went  ahead and elevated it and then dissected free Calot's triangle which was  somewhat engorged and edematous, and I put a clip up on the gallbladder  and incised the cystic duct and did a dynamic cholangiogram.  I then  triple clipped the cystic duct and divided it, triple clipped the cystic  artery and divided it, and then removed the gallbladder without entering  it.  I did, however, have a laceration of the top from where it was  grasped, and it decompressed some of the bile, but no stones were  spilled.  The gallbladder was then detached from the gallbladder bed and  placed in a bag and  brought out through the umbilicus with a little bit  of difficulty just because it had so many  stones in it.  The umbilical  port was then repaired with a pursestring suture 0 Vicryl under  laparoscopic vision.  I bovied the gallbladder bed and controlled the  bleeding as I was removing the gallbladder.  There was 1 bleeder along  the lateral wall that was a little arterial pumper that I controlled  with two clips.  No bile  leaks were noted, and everything looked to be in order.  I injected all  ports with Marcaine.  I withdrew the irrigant and decompressed the  abdomen.  I closed it with 4-0 Vicryl, Benzoin, and Steri-Strips.   The patient tolerated the procedure well and was taken to the recovery  room in satisfactory condition.      Thornton Park Daphine Deutscher, MD  Electronically Signed     MBM/MEDQ  D:  11/23/2006  T:  11/24/2006  Job:  [id number]   cc:   Willow Ora, MD  [phone] [address] [place], [place] [address]

## 2010-06-01 NOTE — Telephone Encounter (Signed)
pennsaid 15 drops bid prn pain , 1 month supply and 1 RF

## 2010-06-01 NOTE — H&P (Signed)
Cheyenne Bullock, Cheyenne Bullock              ACCOUNT NO.:  [id number]   MEDICAL RECORD NO.:  [id number]          PATIENT TYPE:  INP   LOCATION:  1540                         FACILITY:  [facility]   PHYSICIAN:  Thornton Park. Daphine Deutscher, MD  DATE OF BIRTH:  10/29/1960   DATE OF ADMISSION:  11/22/2006  DATE OF DISCHARGE:                              HISTORY & PHYSICAL   CHIEF COMPLAINT:  Upper abdominal pain, unrelenting.   HISTORY:  Cheyenne Bullock is a 50 year old white female originally from  [place], who presented to the emergency room on the evening of November 22, 2006, with upper abdominal pain that had been lasting about a day and  a half.  She has had attacks of pain before in the right upper quadrant  but they all went away, but this was persisted.  She was brought to the  emergency room when she bent over to use the bathroom and suddenly the  pain would not go away and seemed to get worse.  She was evaluated by  Dr. Estell Harpin and Dr. Weldon Inches and that evaluation included a gallbladder  ultrasound which demonstrated multiple gallstones with gallbladder wall  thickening and pericholecystic fluid.  I was consulted to evaluate her  and am doing so on November 23, 2006, in the emergency department.   PAST MEDICAL HISTORY:  Remarkable in that she has hypertension treated  with metoprolol 25 mg twice a day.  Her prior surgery includes some  removal of cysts on her ovaries and she had a cesarean section with her  daughter.  Last menstrual period was November 02, 2006.   SOCIAL HISTORY:  She is married.   REVIEW OF SYSTEMS:  Negative for neurologic problems, cardiovascular  problems, GI problems except for the pain that she is having presently,  or other chronic medical problems.   FAMILY HISTORY:  Noncontributory.   PHYSICAL EXAM:  Temperature afebrile, blood pressure 168/102, pulse 96,  respirations 20.  Head:  Normocephalic.  Eyes: Sclerae nonicteric.  Pupils equal, round,  and reactive to light.   Nose and throat exam unremarkable.  CHEST:  Clear to auscultation.  HEART:  Sinus tachycardia.  ABDOMEN:  Still sore in the right upper quadrant despite Toradol given  by the ER nurses.  EXTREMITIES:  Full range of motion.  No cyanosis, edema or clubbing.  SKIN:  No skin lesions noted.  NEUROLOGIC:  Alert and oriented x3.  Motor and sensory function are  grossly intact.   LABORATORY:  Hemoglobin 13.5, white count 15,900 with 78% neutrophils.  Laboratory-wise, she has an elevated glucose and that was really all  that was noted.  Her serum transaminases are not all completely viewed  on the ER record and creatinine is 1.  Urine was negative.   IMPRESSION:  Acute cholecystitis.   I have discussed laparoscopic and open cholecystectomy with her in  detail, including complications not limited to bleeding, infection,  common bile duct injury or bile leaks.  She wants to go ahead and  proceed with surgery and I have gone ahead and scheduled this follow a  case here on this  Thursday, November 23, 2006, and it would either be  done by myself or one of my partners at [facility].      Thornton Park Daphine Deutscher, MD  Electronically Signed     MBM/MEDQ  D:  11/23/2006  T:  11/23/2006  Job:  [id number]   cc:   Willow Ora, MD  [phone] [address] [place], [place] [address]

## 2010-06-01 NOTE — H&P (Signed)
NAMEADALIDA, Cheyenne Bullock              ACCOUNT NO.:  [id number]   MEDICAL RECORD NO.:  [id number]          PATIENT TYPE:  AMB   LOCATION:  [facility]C                           FACILITY:  [facility]   PHYSICIAN:  Charles A. Delcambre, MDDATE OF BIRTH:  03/07/1960   DATE OF ADMISSION:  09/28/2006  DATE OF DISCHARGE:                              HISTORY & PHYSICAL   This patient will be admitted on September 28, 2006 to undergo LEEP  therapy of a 6 o'clock polyp that is friable, causing irregular  bleeding.  She is 50 years old, para 3-0-0-3, who was to have a LEEP in  the office, but did not tolerate the injection of local anesthetic  lidocaine.  For this reason, she will be admitted for a likely general  anesthetic or very heavy MAC to undergo the LEEP therapy.  No lidocaine  or paracervical block.  The patient agrees.  She gives informed consent.  We discussed the risks of infection, bleeding, and damage to surrounding  tissue.  All questions were answered, and we will proceed.   PAST MEDICAL HISTORY:  Ovarian cyst treated surgically.   PAST SURGICAL HISTORY:  Laparoscopic surgery for a cyst or torsion of  the ovary, lemon sized and orange sized bilaterally, in 1988, she  states.   MEDICATIONS:  None.   ALLERGIES:  No known drug allergies.   SOCIAL HISTORY:  No tobacco, ethanol, or drug use.  She is married, in a  monogamous relationship with her husband.  She writes for the paper, a  column.   FAMILY HISTORY:  Hypertension and heart disease in her father, deceased  at age 22.  Mother age 99, alive and well.  Brother deceased at age 20  of testicular cancer.  Sister and brother 55 and 74, respectively, in  good health.  No other major illnesses.   REVIEW OF SYSTEMS:  Some menorrhagia and friability.  Bleeding with  intercourse occasionally.  No chest pain, shortness of breath, wheezing,  or other significant changes.   PHYSICAL EXAMINATION:  GENERAL:  Alert and oriented x3, no distress.  HEENT:  Grossly within normal limits.  LUNGS:  Clear bilaterally.  HEART:  Regular rate and rhythm, without murmur, rub, or gallop.  BREASTS:  No masses, tenderness, or discharge, skin, or nipple changes  bilaterally.  ABDOMEN:  Soft, flat, and nontender.  No hepatosplenomegaly.  PELVIC:  Normal external female genitalia.  Bartholin's, urethra, and  Skene's within normal limits.  Vault without discharge or lesions.  Cervix with a 6 o'clock sessile polyp, friable.  Of note, Pap smears  have been benign, and biopsy of the polyp was benign.  Bimanual  examination:  Uterus not enlarged, adnexa nontender, without masses  bilaterally.  Ovaries palpable and normal in size bilaterally.  VITAL SIGNS:  Blood pressure 138/90, heart rate 80, respirations 16.   A:Sessile , friable bervical polyp.  Intolerant of LEEP trial /  Lidocaine (plain) in the office.   P:All questions were answered, and we will proceed as outlined with the  cervical polyp, symptomatic, and not tolerating LEEP in the office, with  a LEEP procedure in the hospital.  Preop CBC, npo post midnight.  Accepts risks bleeding including HIV and hepatitis, surrounding tissue,  vagina, rectum damage, infection.      Charles A. Sydnee Cabal, MD  Electronically Signed     CAD/MEDQ  D:  08/30/2006  T:  08/31/2006  Job:  [id number]

## 2010-06-02 MED ORDER — DICLOFENAC SODIUM 1.5 % TD SOLN: TRANSDERMAL | Status: DC

## 2010-06-04 NOTE — Assessment & Plan Note (Signed)
[facility]                                 ON-CALL NOTE   ADAIRA, CENTOLA                       MRN:          [id number]  DATE:11/22/2006                            DOB:          1960/03/04    TIME RECEIVED:  8:30 p.m.   CALLER:  Alisyn Lequire.   She sees Dr. Drue Novel.   PHONE NUMBER:  [phone].   The patient has been having constant abdominal pains for the past few  days, but denies they are getting much worse.  At first, it began in the  right flank and the right upper quadrant.  Then, earlier this evening,  it moved to the right lower quadrant, and now extends across the entire  lower abdomen.  It has become quite severe.  She is nauseated, but has  not vomited.  There is no apparent fever.  My advice is that she have  her husband drive her to the emergency room immediately.  Certainly  appendicitis is a possible concern.  I advised her to take nothing by  mouth in the meantime.     Tera Mater. Clent Ridges, MD  Electronically Signed    SAF/MedQ  DD: 11/22/2006  DT: 11/23/2006  Job #: [id number]

## 2010-06-08 ENCOUNTER — Other Ambulatory Visit: Payer: Self-pay | Admitting: Internal Medicine

## 2010-06-16 ENCOUNTER — Telehealth: Payer: Self-pay

## 2010-06-16 NOTE — Telephone Encounter (Signed)
Prior auth received on [place]aid medication has been approved from 05/25/10-09/13/10

## 2010-06-21 ENCOUNTER — Encounter: Payer: Self-pay | Admitting: Gastroenterology

## 2010-06-21 ENCOUNTER — Ambulatory Visit (INDEPENDENT_AMBULATORY_CARE_PROVIDER_SITE_OTHER): Payer: BC Managed Care – PPO | Admitting: Gastroenterology

## 2010-06-21 VITALS — BP 132/76 | HR 68 | Ht 63.0 in | Wt 144.0 lb

## 2010-06-21 DIAGNOSIS — Z800 Family history of malignant neoplasm of digestive organs: Secondary | ICD-10-CM

## 2010-06-21 DIAGNOSIS — K589 Irritable bowel syndrome without diarrhea: Secondary | ICD-10-CM

## 2010-06-21 MED ORDER — HYOSCYAMINE SULFATE ER 0.375 MG PO TBCR: EXTENDED_RELEASE_TABLET | ORAL | Status: DC

## 2010-06-21 NOTE — Progress Notes (Signed)
History of Present Illness:  Cheyenne Bullock is a pleasant 50 year old white female referred at the request of Dr. Drue Novel for evaluation of abdominal pain. For years she's been experiencing pain that begins in the right lower quadrant upon completing a bowel movement. It may then radiate to the left lower quadrant and more centrally. It lasts for hours until she goes to sleep. In the mornings she'll awaken feeling fine. Her daily bowel movement will trigger similar pain. There's been no change in bowel habits, melena or hematochezia. Colonoscopy in 2010 was normal. Family history is pertinent for a parent with colon cancer. She complains of mild bloating. She maintains a high fiber diet.    Review of Systems: Pertinent positive and negative review of systems were noted in the above HPI section. All other review of systems were otherwise negative.    Current Medications, Allergies, Past Medical History, Past Surgical History, Family History and Social History were reviewed in [employer] electronic medical record  Vital signs were reviewed in today's medical record. Physical Exam: General: Well developed , well nourished, no acute distress Head: Normocephalic and atraumatic Eyes:  sclerae anicteric, EOMI Ears: Normal auditory acuity Mouth: No deformity or lesions Lungs: Clear throughout to auscultation Heart: Regular rate and rhythm; no murmurs, rubs or bruits Abdomen: Soft, non tender and non distended. No masses, hepatosplenomegaly or hernias noted. Normal Bowel sounds Rectal:deferred Musculoskeletal: Symmetrical with no gross deformities  Pulses:  Normal pulses noted Extremities: No clubbing, cyanosis, edema or deformities noted Neurological: Alert oriented x 4, grossly nonfocal Psychological:  Alert and cooperative. Normal mood and affect

## 2010-06-21 NOTE — Assessment & Plan Note (Signed)
I suspect that her abdominal pain is related to this. Bloating may be due to excess fiber intake.  Recommendations #1 trial of hyomax 0.375 mg twice a day for 5 days then when necessary #2 reduce fiber consumption

## 2010-06-21 NOTE — Patient Instructions (Signed)
Follow up as needed

## 2010-06-21 NOTE — Assessment & Plan Note (Signed)
Plan colonoscopy every 5 years 

## 2010-06-24 ENCOUNTER — Telehealth: Payer: Self-pay | Admitting: *Deleted

## 2010-06-24 MED ORDER — HYOSCYAMINE SULFATE 0.125 MG PO TABS: 0.1250 mg | ORAL_TABLET | ORAL | Status: AC | PRN

## 2010-06-24 NOTE — Telephone Encounter (Signed)
Hyomax DT not available, resent in hyosyamine .0125 for pt.

## 2010-06-29 ENCOUNTER — Encounter: Payer: Self-pay | Admitting: Gastroenterology

## 2010-06-29 NOTE — Telephone Encounter (Signed)
Error

## 2010-07-23 ENCOUNTER — Other Ambulatory Visit: Payer: Self-pay | Admitting: Internal Medicine

## 2010-08-19 ENCOUNTER — Other Ambulatory Visit: Payer: Self-pay | Admitting: Internal Medicine

## 2010-08-31 ENCOUNTER — Encounter: Payer: Self-pay | Admitting: Internal Medicine

## 2010-09-17 ENCOUNTER — Other Ambulatory Visit: Payer: Self-pay | Admitting: Internal Medicine

## 2010-09-20 ENCOUNTER — Other Ambulatory Visit: Payer: Self-pay | Admitting: Internal Medicine

## 2010-09-21 NOTE — Telephone Encounter (Signed)
Rx Done . 

## 2010-10-26 LAB — COMPREHENSIVE METABOLIC PANEL
AST: 35
Albumin: 4
Calcium: 9.9
Chloride: 102
Creatinine, Ser: 1.01
GFR calc Af Amer: >60
Total Protein: 7.5

## 2010-10-26 LAB — CBC
HCT: 32.8 — ABNORMAL LOW
Hemoglobin: 11.4 — ABNORMAL LOW
MCHC: 34.6
MCHC: 34.8
MCV: 86.1
Platelets: 374
RDW: 13.8
WBC: 15.9 — ABNORMAL HIGH

## 2010-10-26 LAB — DIFFERENTIAL
Eosinophils Relative: 1
Lymphocytes Relative: 14
Lymphs Abs: 2.2
Monocytes Absolute: 1 — ABNORMAL HIGH

## 2010-10-26 LAB — URINALYSIS, ROUTINE W REFLEX MICROSCOPIC
Glucose, UA: NEGATIVE
Ketones, ur: NEGATIVE
Leukocytes, UA: NEGATIVE
pH: 5.5

## 2010-10-26 LAB — URINE MICROSCOPIC-ADD ON

## 2010-10-28 LAB — BASIC METABOLIC PANEL
BUN: 9
Calcium: 9.2
GFR calc non Af Amer: >60
Glucose, Bld: 98
Potassium: 3.9

## 2010-10-28 LAB — CBC
HCT: 42
MCHC: 34.2
Platelets: 398
RDW: 13.4

## 2010-11-24 ENCOUNTER — Other Ambulatory Visit (HOSPITAL_COMMUNITY)
Admission: RE | Admit: 2010-11-24 | Discharge: 2010-11-24 | Disposition: A | Discharge status: Home / Self Care | Payer: BC Managed Care – PPO | Source: Ambulatory Visit | Attending: Family Medicine | Admitting: Family Medicine

## 2010-11-24 ENCOUNTER — Ambulatory Visit (INDEPENDENT_AMBULATORY_CARE_PROVIDER_SITE_OTHER): Payer: BC Managed Care – PPO | Admitting: Family Medicine

## 2010-11-24 ENCOUNTER — Encounter: Payer: Self-pay | Admitting: Family Medicine

## 2010-11-24 DIAGNOSIS — Z124 Encounter for screening for malignant neoplasm of cervix: Secondary | ICD-10-CM

## 2010-11-24 DIAGNOSIS — L680 Hirsutism: Secondary | ICD-10-CM

## 2010-11-24 DIAGNOSIS — Z01419 Encounter for gynecological examination (general) (routine) without abnormal findings: Secondary | ICD-10-CM | POA: Insufficient documentation

## 2010-11-24 DIAGNOSIS — K589 Irritable bowel syndrome without diarrhea: Secondary | ICD-10-CM

## 2010-11-24 DIAGNOSIS — N951 Menopausal and female climacteric states: Secondary | ICD-10-CM

## 2010-11-24 MED ORDER — DICYCLOMINE HCL 20 MG PO TABS: 20.0000 mg | ORAL_TABLET | Freq: Three times a day (TID) | ORAL | Status: DC | PRN

## 2010-11-24 NOTE — Progress Notes (Signed)
  Subjective:    Patient ID: Cheyenne Bullock, female    DOB: November 10, 1960, 50 y.o.   MRN: [id number]  HPI Here today for pap.  GYN- DelCambre.  UTD on mammo  Menopausal sxs- reports increased facial hair, decreased sex drive, mood swings, vaginal dryness, poor sleep.  S/p uterine ablation so no menses.  IBS- reports abdominal bloating.  Sees GI ([facility]).  Has fiber rich diet, drinks plenty of water.   Review of Systems For ROS see HPI     Objective:   Physical Exam  Vitals reviewed. Constitutional: She appears well-developed and well-nourished.  Pulmonary/Chest: Right breast exhibits no inverted nipple, no mass, no nipple discharge, no skin change and no tenderness. Left breast exhibits no inverted nipple, no mass, no nipple discharge, no skin change and no tenderness. Breasts are symmetrical.  Genitourinary: Rectum normal and uterus normal. No labial fusion. There is no rash, tenderness, lesion or injury on the right labia. There is no rash, tenderness, lesion or injury on the left labia. Cervix exhibits no motion tenderness, no discharge and no friability. Right adnexum displays no mass, no tenderness and no fullness. Left adnexum displays no mass, no tenderness and no fullness. No erythema, tenderness or bleeding around the vagina. No foreign body around the vagina. No signs of injury around the vagina. No vaginal discharge found.  Skin: Skin is warm and dry.       No hirsutism noted- pt reports she has laser tx           Assessment & Plan:

## 2010-11-24 NOTE — Patient Instructions (Signed)
We'll notify you of your lab results Start the Bentyl as needed for abdominal spasm/bloating If bloating continues or worsens- please call Dr Arlyce Dice Start one of the OTC menopause medications- Black Cohosh, Marja Kays If the menopausal symptoms worsen- please call and we can discuss options Call with any questions or concerns Hang in there!

## 2010-11-25 LAB — TESTOSTERONE, FREE, TOTAL, SHBG: Testosterone-% Free: 2.2 % (ref 0.4–2.4)

## 2010-11-25 LAB — LUTEINIZING HORMONE: LH: 30.07 m[IU]/mL

## 2010-11-30 ENCOUNTER — Encounter: Payer: Self-pay | Admitting: *Deleted

## 2010-12-05 NOTE — Assessment & Plan Note (Signed)
Pap collected. 

## 2010-12-05 NOTE — Assessment & Plan Note (Signed)
Pt unaware of her menopausal status since she had endometrial ablation.  Check hormone levels to determine if her sxs are in fact menopausal.

## 2010-12-05 NOTE — Assessment & Plan Note (Signed)
None evident today but pt reports she has laser tx.  ? PCOS.  Check testosterone levels.

## 2010-12-05 NOTE — Assessment & Plan Note (Signed)
Ongoing problem for pt.  Seeing GI.  Will start Bentyl and see if this helps tx sxs.  Pt expressed understanding and is in agreement w/ plan.

## 2010-12-25 ENCOUNTER — Other Ambulatory Visit: Payer: Self-pay | Admitting: Internal Medicine

## 2011-01-21 ENCOUNTER — Other Ambulatory Visit: Payer: Self-pay | Admitting: Internal Medicine

## 2011-01-21 DIAGNOSIS — K589 Irritable bowel syndrome without diarrhea: Secondary | ICD-10-CM

## 2011-01-21 MED ORDER — DICYCLOMINE HCL 20 MG PO TABS: 20.0000 mg | ORAL_TABLET | Freq: Three times a day (TID) | ORAL | Status: AC | PRN

## 2011-01-21 NOTE — Telephone Encounter (Signed)
rx sent to pharmacy by e-script  

## 2011-02-16 ENCOUNTER — Other Ambulatory Visit: Payer: Self-pay | Admitting: Internal Medicine

## 2011-02-16 NOTE — Telephone Encounter (Signed)
Refill done.  

## 2011-03-23 ENCOUNTER — Other Ambulatory Visit: Payer: Self-pay | Admitting: Internal Medicine

## 2011-03-23 NOTE — Telephone Encounter (Signed)
Refill done.  

## 2011-07-08 ENCOUNTER — Other Ambulatory Visit: Payer: Self-pay | Admitting: Internal Medicine

## 2011-07-08 NOTE — Telephone Encounter (Signed)
Refilled for 1 month. Pt is due for an office visit.

## 2011-07-14 ENCOUNTER — Other Ambulatory Visit: Payer: Self-pay | Admitting: Internal Medicine

## 2011-07-14 NOTE — Telephone Encounter (Signed)
Refill done.  

## 2011-07-20 ENCOUNTER — Ambulatory Visit (INDEPENDENT_AMBULATORY_CARE_PROVIDER_SITE_OTHER): Payer: BC Managed Care – PPO | Admitting: Internal Medicine

## 2011-07-20 VITALS — BP 132/84 | HR 80 | Temp 97.8°F | Wt 143.0 lb

## 2011-07-20 DIAGNOSIS — R202 Paresthesia of skin: Secondary | ICD-10-CM

## 2011-07-20 DIAGNOSIS — I10 Essential (primary) hypertension: Secondary | ICD-10-CM

## 2011-07-20 DIAGNOSIS — Z0000 Encounter for general adult medical examination without abnormal findings: Secondary | ICD-10-CM

## 2011-07-20 DIAGNOSIS — K589 Irritable bowel syndrome without diarrhea: Secondary | ICD-10-CM

## 2011-07-20 DIAGNOSIS — R209 Unspecified disturbances of skin sensation: Secondary | ICD-10-CM

## 2011-07-20 DIAGNOSIS — E785 Hyperlipidemia, unspecified: Secondary | ICD-10-CM

## 2011-07-20 NOTE — Assessment & Plan Note (Signed)
Bilateral hand paresthesias of unclear etiology. Will check a B12, folic acid.

## 2011-07-20 NOTE — Assessment & Plan Note (Signed)
More than 100 pages of old records from her previous doctors. dating from years ago where return to the patient for safe keeping

## 2011-07-20 NOTE — Patient Instructions (Addendum)
Please come back fasting: FLP, AST, ALT--- dx  high cholesterol B12, folic acid --- dx paresthesias ----- Come back in 4-6 months for a physical exam

## 2011-07-20 NOTE — Assessment & Plan Note (Signed)
This in the last cholesterol panel and I recommend increased Lipitor from 20 to 40 mg, apparently she still taking 20 mg. Her diet and exercise has improved. Plan: FLP, AST, ALT

## 2011-07-20 NOTE — Assessment & Plan Note (Signed)
Still complaining of bloating. Followup by GI

## 2011-07-20 NOTE — Assessment & Plan Note (Signed)
Seems to be well controlled.

## 2011-07-20 NOTE — Progress Notes (Signed)
  Subjective:    Patient ID: Cheyenne Bullock, female    DOB: February 08, 1960, 51 y.o.   MRN: [id number]  HPI Routine office visit High cholesterol, on Lipitor 20 mg. Diet has improved, she is exercising more. Has not been able to lose weight. Also few months history of bilateral tingling in the fingers, mostly in the morning.  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 Denies neck pain per se. No lower extremity paresthesias.    Objective:   Physical Exam General -- alert, well-developed, and overweight appearing. No apparent distress.  Lungs -- normal respiratory effort, no intercostal retractions, no accessory muscle use, and normal breath sounds.   Heart-- normal rate, regular rhythm, no murmur, and no gallop.   Extremities-- no pretibial edema bilaterally  Neurologic-- alert & oriented X3, neck is full range of motion without pain, motor and DTRs symmetric. Psych-- Cognition and judgment appear intact. Alert and cooperative with normal attention span and concentration.  not anxious appearing and not depressed appearing.       Assessment & Plan:

## 2011-07-22 ENCOUNTER — Encounter: Payer: Self-pay | Admitting: Internal Medicine

## 2011-08-03 ENCOUNTER — Encounter: Payer: BC Managed Care – PPO | Admitting: Family Medicine

## 2011-08-07 ENCOUNTER — Other Ambulatory Visit: Payer: Self-pay | Admitting: Internal Medicine

## 2011-08-08 NOTE — Telephone Encounter (Signed)
Refill done.  

## 2011-08-11 ENCOUNTER — Other Ambulatory Visit: Payer: Self-pay | Admitting: Internal Medicine

## 2011-08-12 NOTE — Telephone Encounter (Signed)
Refill done.  

## 2011-08-29 ENCOUNTER — Encounter: Payer: Self-pay | Admitting: Internal Medicine

## 2012-11-20 ENCOUNTER — Other Ambulatory Visit (HOSPITAL_COMMUNITY)
Admission: RE | Admit: 2012-11-20 | Discharge: 2012-11-20 | Disposition: A | Discharge status: Home / Self Care | Payer: BC Managed Care – PPO | Source: Ambulatory Visit | Attending: Family Medicine | Admitting: Family Medicine

## 2012-11-20 ENCOUNTER — Other Ambulatory Visit: Payer: Self-pay | Admitting: Family Medicine

## 2012-11-20 DIAGNOSIS — Z1151 Encounter for screening for human papillomavirus (HPV): Secondary | ICD-10-CM | POA: Insufficient documentation

## 2012-11-20 DIAGNOSIS — Z124 Encounter for screening for malignant neoplasm of cervix: Secondary | ICD-10-CM | POA: Insufficient documentation

## 2013-04-01 ENCOUNTER — Encounter: Payer: Self-pay | Admitting: Gastroenterology

## 2013-07-10 ENCOUNTER — Encounter: Payer: Self-pay | Admitting: Gastroenterology

## 2013-09-13 ENCOUNTER — Ambulatory Visit (AMBULATORY_SURGERY_CENTER): Payer: Self-pay | Admitting: *Deleted

## 2013-09-13 ENCOUNTER — Other Ambulatory Visit (HOSPITAL_COMMUNITY): Payer: Self-pay | Admitting: Otolaryngology

## 2013-09-13 VITALS — Ht 61.5 in | Wt 140.2 lb

## 2013-09-13 DIAGNOSIS — Z800 Family history of malignant neoplasm of digestive organs: Secondary | ICD-10-CM

## 2013-09-13 DIAGNOSIS — E041 Nontoxic single thyroid nodule: Secondary | ICD-10-CM

## 2013-09-13 MED ORDER — NA SULFATE-K SULFATE-MG SULF 17.5-3.13-1.6 GM/177ML PO SOLN: 1.0000 | Freq: Once | ORAL | Status: DC

## 2013-09-13 NOTE — Progress Notes (Signed)
Denies allergies to eggs or soy products. Denies complications with sedation or anesthesia. Denies O2 use. Denies use of diet or weight loss medications.  Emmi instructions given for colonoscopy.  

## 2013-09-17 ENCOUNTER — Other Ambulatory Visit (HOSPITAL_COMMUNITY): Payer: Self-pay | Admitting: Interventional Radiology

## 2013-09-17 ENCOUNTER — Inpatient Hospital Stay
Admission: RE | Admit: 2013-09-17 | Discharge: 2013-09-17 | Disposition: A | Discharge status: Home / Self Care | Payer: Self-pay | Source: Ambulatory Visit | Attending: Interventional Radiology | Admitting: Interventional Radiology

## 2013-09-17 ENCOUNTER — Ambulatory Visit (HOSPITAL_COMMUNITY)
Admission: RE | Admit: 2013-09-17 | Discharge: 2013-09-17 | Disposition: A | Discharge status: Home / Self Care | Payer: BC Managed Care – PPO | Source: Ambulatory Visit | Attending: Otolaryngology | Admitting: Otolaryngology

## 2013-09-17 DIAGNOSIS — E041 Nontoxic single thyroid nodule: Secondary | ICD-10-CM

## 2013-09-17 DIAGNOSIS — R52 Pain, unspecified: Secondary | ICD-10-CM

## 2013-09-19 ENCOUNTER — Encounter (HOSPITAL_COMMUNITY): Payer: Self-pay | Admitting: Pharmacy Technician

## 2013-09-20 ENCOUNTER — Encounter: Payer: Self-pay | Admitting: Gastroenterology

## 2013-09-20 ENCOUNTER — Ambulatory Visit (HOSPITAL_COMMUNITY)
Admission: RE | Admit: 2013-09-20 | Discharge: 2013-09-20 | Disposition: A | Discharge status: Home / Self Care | Payer: BC Managed Care – PPO | Source: Ambulatory Visit | Attending: Otolaryngology | Admitting: Otolaryngology

## 2013-09-20 DIAGNOSIS — E041 Nontoxic single thyroid nodule: Secondary | ICD-10-CM | POA: Insufficient documentation

## 2013-09-20 MED ORDER — LIDOCAINE HCL (PF) 1 % IJ SOLN
INTRAMUSCULAR | Status: AC
  Filled 2013-09-20: qty 10

## 2013-09-20 NOTE — Procedures (Signed)
L thyroid Bx FNA 25 g times 4 Comp none

## 2013-09-27 ENCOUNTER — Telehealth: Payer: Self-pay | Admitting: Gastroenterology

## 2013-09-27 ENCOUNTER — Encounter: Payer: BC Managed Care – PPO | Admitting: Gastroenterology

## 2013-09-27 NOTE — Telephone Encounter (Signed)
Returned patient's call and she states that she cannot keep second half of prep down.  I advised her to come on in at her scheduled time and we would consult with Dr. Deatra Ina and give enema if needed.  She insisted that she did not want to do the procedure and wanted to cancel.  I advised her that I would cancel and send Dr. Deatra Ina a note.  I also advised her that she may be charged and she understood.

## 2013-10-07 ENCOUNTER — Encounter: Payer: Self-pay | Admitting: Gastroenterology

## 2013-11-04 DIAGNOSIS — C73 Malignant neoplasm of thyroid gland: Secondary | ICD-10-CM | POA: Insufficient documentation

## 2013-11-05 DIAGNOSIS — R490 Dysphonia: Secondary | ICD-10-CM | POA: Insufficient documentation

## 2013-11-12 DIAGNOSIS — C801 Malignant (primary) neoplasm, unspecified: Secondary | ICD-10-CM

## 2013-11-12 HISTORY — DX: Malignant (primary) neoplasm, unspecified: C80.1

## 2013-11-13 ENCOUNTER — Telehealth: Payer: Self-pay | Admitting: *Deleted

## 2013-11-13 NOTE — Telephone Encounter (Signed)
Called pt in regards to her complaint about her appointment on 10/01/13 for colonoscopy.  We discussed her issues and I apologized for her situation.  She will need to reschedule and I advised her that we would give her a different option as well as an antiemetic to take with her prep.

## 2015-05-12 ENCOUNTER — Ambulatory Visit: Payer: BC Managed Care – PPO | Attending: Family Medicine

## 2015-05-12 DIAGNOSIS — R498 Other voice and resonance disorders: Secondary | ICD-10-CM

## 2015-05-12 NOTE — Patient Instructions (Addendum)
Vocal Fold Adduction Exercises (for voice and swallowing)  . Super-supraglottic swallow o Take a breath & hold it,  o Bear down, swallow, gently cough o 5 repetitions, 3-4x a day  . "HA!" with upward pull o Say "ha!" in a louder voice, pulling up on chair o 5 repetitions,  3-4x a day  . "HA!" with downward push o Say "ha!" in a louder voice, pushing down on a chair o 5 repetitions,  3-4x a day  . Vocal fold adduction o Say "Huh!" with mouth open, & hold your breath for 3 seconds o 5 repetitions, 3-4x a day  . Supraglottic swallow o Take a breath & hold it o Swallow - gently cough o 5 repetitions, 3-4x a day   If voice becomes worse, stop doing these exercises

## 2015-05-12 NOTE — Therapy (Signed)
[facility] [address] [place], [place], [address] Phone: [phone]   Fax:  [phone]  Speech Language Pathology Evaluation  Patient Details  Name: Cheyenne Bullock MRN: [id number] Date of Birth: 1960-02-14 Referring MD:  Kathyrn Lass M.D.  Encounter Date: 05/12/2015      End of Session - 05/12/15 1219    Visit Number 1   Number of Visits 17   Date for SLP Re-Evaluation 07/17/15   Authorization Type no notes in EPIC re: insurance at this time   SLP Start Time 1107   SLP Stop Time  1153   SLP Time Calculation (min) 46 min   Activity Tolerance Patient tolerated treatment well      Past Medical History  Diagnosis Date  . Hypertension   . Arthritis   . Hyperlipemia   . Abdominal pain, generalized   . Left thyroid nodule     Past Surgical History  Procedure Laterality Date  . Cesarean section  01/18/91  . Cystectomy      from both ovaries  . Cholecystectomy  11/08  . Uterine ablation  2008    There were no vitals filed for this visit.      Subjective Assessment - 05/12/15 1125    Subjective Pt presents with mod hoarse voice today.    Currently in Pain? No/denies            SLP Evaluation [facility] - 05/12/15 1125    SLP Visit Information   SLP Received On 05/12/15   General Information   HPI Pt with thyroid cancer Oct 2015 thyroidectomy. Hoarseness began shortly prior to that. Pt with  Last ENT visit Nov 2016 pt desired voice therapy instead of surgical option for "sluggish rt vocal fold". Mild diffuse erythema, modest posterior laryngeal edema, rt fold slightly atrophied, 1 m glottal gap at the point of vocal process to vocal process contact. Muscle tension was observed in order to achieve fold closure.   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 Pt rated her voice 5/10 (10=normal voice)                      ADULT SLP TREATMENT - 05/12/15 1230    Cognitive-Linquistic Treatment   Treatment focused on Voice   Skilled Treatment SLP introduced pt to voice adduction exercises and educated pt on reasons for exercise completion. Pt was independent with each exercise by end of session. She will require cont'd assessment of exercises to ensure proper completion.           SLP Education - 05/12/15 1150    Education provided Yes   Education Details HEP (vocal adduction)   Person(s) Educated Patient   Methods Explanation;Demonstration;Verbal cues;Handout   Comprehension Verbalized understanding;Returned demonstration;Need further instruction;Verbal cues required          SLP Short Term Goals - 05/12/15 1224    SLP SHORT TERM GOAL #  1   Title pt will demo HEP with rare min A   Time 4   Period Weeks   Status New   SLP SHORT TERM GOAL #2   Title s/z ratio will improve to <1.5   Time 4   Period Weeks   Status New   SLP SHORT TERM GOAL #3   Title sustained /a/ will improve to average 7 seconds   Time 4   Period Weeks   Status New   SLP SHORT TERM GOAL #4   Title pt will tell SLP more vocally hygenic options to throat clearing   Time 4   Period Weeks   Status New   SLP SHORT TERM GOAL #5   Title pt will demo abdominal breatihng in sentences 75% success   Time 4   Period Weeks          SLP Long Term Goals - 05/12/15 1226    SLP LONG TERM GOAL #1    Title s/z ratio will improve to </= 1.4   Time 8   Status New   SLP LONG TERM GOAL #2   Title pt will improve sustained /a/ average 8 seconds   Time 8   Period Weeks   Status New   SLP LONG TERM GOAL #3   Title pt will demo Vocal Handicap Index score of <25   Time 8   Period Weeks   Status New   SLP LONG TERM GOAL #4   Title pt will demo more hygenic options other than throat clears in 4 ST sessions   Time 8   Period Weeks   Status New   SLP LONG TERM GOAL #5   Title pt will demo abdominal breathing in simple conversation or public speaking-type situation 75% success   Time 8   Period Weeks   Status New          Plan - 05/12/15 1220    Clinical Impression Statement Pt presents today with mod hoarse voice (self-rated 5/10 - 10=WNL) and a Voice Handicap Index score of 33. Ratio of s/z and sustained /a/ both outside of WNL. Pt is a chest breather who cleared her throat 18 times in a 45 mintue evaluation. 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 Pt would benefit from skilled ST addressing modification of throat clearing to less abusive behavior, abdominal breathing, and vocal adduction exercises to strengthen vocal fold closure.   Speech Therapy Frequency 2x / week   Duration --  8 weeks   Treatment/Interventions Compensatory strategies;Patient/family education;SLP instruction and feedback  HEP for vocal adduction   Potential to Achieve Goals Fair   Potential Considerations Severity of impairments   SLP Home Exercise Plan provided today   Consulted and Agree with Plan of Care Patient      Patient will benefit from skilled therapeutic intervention in order to improve the following deficits and impairments:   Other voice and resonance disorders    Problem List Patient Active Problem List   Diagnosis Date Noted  . Paresthesias 07/20/2011  . Hirsutism 11/24/2010  . Menopausal syndrome 11/24/2010  . Screening for malignant neoplasm of the cervix  11/24/2010  . Family history of malignant neoplasm of gastrointestinal tract 06/21/2010  . Annual physical exam 05/14/2010  . IBS (irritable bowel syndrome) 05/14/2010  . HYPERLIPIDEMIA 11/13/2007  . HYPERTENSION 07/20/2006    SCHINKE,CARL ,MS, [place]  05/12/2015, 12:32 PM  [facility] [address] [place],  [place], [address] Phone: [phone]   Fax:  [phone]  Name: Ariona Kulman MRN: [id number] Date of Birth: 13-Apr-1960

## 2015-05-20 ENCOUNTER — Ambulatory Visit: Payer: BC Managed Care – PPO | Attending: Family Medicine

## 2015-05-20 DIAGNOSIS — R498 Other voice and resonance disorders: Secondary | ICD-10-CM | POA: Insufficient documentation

## 2015-05-20 NOTE — Patient Instructions (Signed)
Practice abdominal breathing 20 minutes - 15 lying down and 5 sitting/standing, twice a day. If you get light headed breathe your old way for 60-90 seconds then return to abdominal breathing.  ========================================================================================    VOICE CONSERVATION PROGRAM  1. Avoid overuse of voice or excessive use of the voice . The vocal cords can become easily fatigued. Try to sort out what is "necessary" versus "unnecessary" talking in your environment. You do not need to STOP talking, but try to limit it as much as possible.  . Think of resting your voice just as long as you talk. For example, if you talk for 5 minutes, rest your voice completely for 5 minutes. . If your voice feels "tired" during the day, try to rest it as much as possible.  2. Avoid using an excessively loud voice or shouting/raising your voice . When you yell or raise your voice, the vocal cords slam into each other, much like a strong hand clap. This causes irritation, and if this irritation continues, hoarseness may increase. . If people in your home talk loudly, ask them to reduce the volume of their voices to help you decrease your volume as well. . Sit near or face the person to whom you are speaking.   3. Avoid talking over background noise . When talking in background noise, speech automatically has increased loudness, and as in number 2 above, continued loud speech can result in increased hoarseness due to irritation to the vocal cords.  . Do not talk over the radio or the TV. Mute them before speaking, go to a quieter place to talk, or sit next to the person with whom you are watching.  4. Talk in a voice that is soft, smooth, and gentle . This allows the vocal cords to come together in a gentle way and allows the air being exhaled from the lungs to do most/all of the work when you are speaking.  5. Avoid excessive throat clearing, coughing, and loud laughter . During  these activities the vocal cords can also be slammed together in a hard way and foster irritation or swelling, which can cause hoarseness. . Try taking sips of room temperature/cool water with hard swallows to clear secretions from the throat, instead of throat clearing or coughing. . If you absolutely must clear your throat, do so as gently as possible. If you find yourself clearing your throat or coughing a lot, consult your physician. . You will want to laugh. Continue to do so, but softly and gently. Do not laugh loudly for long periods of time because this can increase the chances for vocal fold irritation and thus hoarseness. . Lozenges can help reduce the need to clear throat/cough during cold/allergy season.  6. Keep the mouth and throat lubricated . Drink at least 8-10 8 oz. glasses of water per day (64-80 oz.). This water can come in the form of drink mixes.  . Caffeinated beverages such as colas, coffee, and tea (hot tea AND sweet tea) dry out the vocal cords and then can cause irritation and thus hoarseness.  . Drinking water will keep your body hydrated. This will help to decrease secretions in the throat, which cause people to clear their throats or cough. . If you are exercising outside (especially during drier weather), try to breathe through your nose as much as possible. If this is not possible, lifting the tongue up behind the upper teeth when breathing through the mouth will add some moisture to the air  breathed in past the vocal cords.  7. Avoid mouth breathing - breathe through your nose . Your nose serves as a natural filter for dust and dirt particles from the air, and as a humidifier to moisten the air. Vocal cords like to work in moistened, filtered air. When you breathe through your mouth you lose the air filtering and moistening benefits of breathing through the nose. Marland Kitchen Be aware of breathing patterns when sitting quietly (e.g., reading or watching TV). Increase your  awareness and try to change habits from mouth breathing to nose breathing during those times.  8. Avoid environmental and/or ingested irritants . Try to avoid smoke-filled and or dusty environments. These items dry out the vocal cords and cause irritation.  9. Use an air filter if the home is dusty, and/or a humidifier if the air is dry  . This will help to maintain clean, humid air to breathe. Remember, this type of air is what the vocal cords like best.

## 2015-05-20 NOTE — Therapy (Signed)
[facility] [address] [place], [place], [address] Phone: [phone]   Fax:  [phone]  Speech Language Pathology Treatment  Patient Details  Name: Cheyenne Bullock MRN: [id number] Date of Birth: 11-May-1960 No Data Recorded  Encounter Date: 05/20/2015      End of Session - 05/20/15 1325    Visit Number 2   Number of Visits 17   Date for SLP Re-Evaluation 07/17/15   SLP Start Time 1146   SLP Stop Time  1230   SLP Time Calculation (min) 44 min   Activity Tolerance Patient tolerated treatment well      Past Medical History  Diagnosis Date  . Hypertension   . Arthritis   . Hyperlipemia   . Abdominal pain, generalized   . Left thyroid nodule     Past Surgical History  Procedure Laterality Date  . Cesarean section  01/18/91  . Cystectomy      from both ovaries  . Cholecystectomy  11/08  . Uterine ablation  2008    There were no vitals filed for this visit.      Subjective Assessment - 05/20/15 1146    Subjective Mod hoarse voice again today.               ADULT SLP TREATMENT - 05/20/15 1152    General Information   Behavior/Cognition Alert;Cooperative;Pleasant mood   Pain Assessment   Pain Assessment 0-10   Pain Score 4    Pain Location lt hand   Pain Descriptors / Indicators --  stiff   Pain Intervention(s) Monitored during session   Cognitive-Linquistic Treatment   Treatment focused on Voice   Skilled Treatment "I've been doing hte exercises all week." (vocal adduction) SLP educated pt re: abdominal breathing and when sitting pt was approx 40-50% successful. In supine, pt's success incr'd to approx 65% with eventual success at approx 75% with 12 minutes practice and occasional min-mod SLP cues fading to rare min cues. SLP also educated pt with vocal hygiene measures.    Assessment / Recommendations / Plan   Plan Continue with current plan of care   Progression Toward Goals   Progression toward goals Progressing toward goals          SLP Education - 05/20/15 1325    Education provided Yes   Education Details  voice hygiene, abdominal breathing   Person(s) Educated Patient   Methods Explanation;Demonstration;Verbal cues;Handout;Tactile cues   Comprehension Verbalized understanding;Returned demonstration;Verbal cues required;Need further instruction          SLP Short Term Goals - 05/20/15 1328    SLP SHORT TERM GOAL #1   Title pt will demo HEP with rare min A   Time 4   Period Weeks   Status On-going   SLP SHORT TERM GOAL #2   Title s/z ratio will improve to <1.5   Time 4   Period Weeks   Status On-going   SLP SHORT TERM GOAL #3   Title sustained /a/ will improve to average 7 seconds   Time 4   Period Weeks   Status On-going   SLP SHORT TERM GOAL #4   Title pt will tell SLP more vocally hygenic options to throat clearing   Time 4   Period Weeks   Status On-going   SLP SHORT TERM GOAL #5   Title pt will demo abdominal breatihng in sentences 75% success   Time 4   Period Weeks   Status On-going  SLP Long Term Goals - 05/20/15 1328    SLP LONG TERM GOAL #1   Title s/z ratio will improve to </= 1.4   Time 8   Period Weeks   Status On-going   SLP LONG TERM GOAL #2   Title pt will improve sustained /a/ average 8 seconds   Time 8   Period Weeks   Status On-going   SLP LONG TERM GOAL #3   Title pt will demo Vocal Handicap Index score of <25   Time 8   Period Weeks   Status On-going   SLP LONG TERM GOAL #4   Title pt will demo more hygenic options other than throat clears in 4 ST sessions   Time 8   Period Weeks   Status On-going   SLP LONG TERM GOAL #5   Title pt will demo abdominal breathing in simple conversation or public speaking-type situation 75% success   Time 8   Period Weeks   Status On-going          Plan - 05/20/15 1326    Clinical Impression Statement Pt began HEP for abdominal breathing today,  with cues needed from SLP. Also taught today was voice hygiene. Pt will cont to benefit from skilled ST to address voice concerns.   Speech Therapy Frequency 2x / week   Duration --  8 weeks   Treatment/Interventions Compensatory strategies;Patient/family education;SLP instruction and feedback   Potential to Achieve Goals Fair   Potential Considerations Severity of impairments   SLP Home Exercise Plan provided today   Consulted and Agree with Plan of Care Patient      Patient will benefit from skilled therapeutic intervention in order to improve the following deficits and impairments:   Other voice and resonance disorders    Problem List Patient Active Problem List   Diagnosis Date Noted  . Paresthesias 07/20/2011  . Hirsutism 11/24/2010  . Menopausal syndrome 11/24/2010  . Screening for malignant neoplasm of the cervix 11/24/2010  . Family history of malignant neoplasm of gastrointestinal tract 06/21/2010  . Annual physical exam 05/14/2010  . IBS (irritable bowel syndrome) 05/14/2010  . HYPERLIPIDEMIA 11/13/2007  . HYPERTENSION 07/20/2006    [facility] ,MS, CCC-SLP  05/20/2015, 1:29 PM  [facility] [address] [place], [place], [address] Phone: [phone]   Fax:  [phone]   Name: Cheyenne Bullock MRN: [id number] Date of Birth: 03-21-1960

## 2015-05-26 ENCOUNTER — Ambulatory Visit: Payer: BC Managed Care – PPO

## 2015-05-26 DIAGNOSIS — R498 Other voice and resonance disorders: Secondary | ICD-10-CM | POA: Diagnosis not present

## 2015-05-26 NOTE — Therapy (Signed)
[facility] [address] [place], [place], [address] Phone: [phone]   Fax:  [phone]  Speech Language Pathology Treatment  Patient Details  Name: Cheyenne Bullock MRN: [id number] Date of Birth: [birth year] No Data Recorded  Encounter Date: 05/26/2015      End of Session - 05/26/15 1451    Visit Number 3   Number of Visits 17   Date for SLP Re-Evaluation 07/17/15   SLP Start Time 65   SLP Stop Time  1446   SLP Time Calculation (min) 38 min   Activity Tolerance Patient tolerated treatment well      Past Medical History  Diagnosis Date  . Hypertension   . Arthritis   . Hyperlipemia   . Abdominal pain, generalized   . Left thyroid nodule     Past Surgical History  Procedure Laterality Date  . Cesarean section  01/18/91  . Cystectomy      from both ovaries  . Cholecystectomy  11/08  . Uterine ablation  2008    There were no vitals filed for this visit.      Subjective Assessment - 05/26/15 1421    Subjective Mod hoarse voice continues. Pt says voice is improving, slowly.               ADULT SLP TREATMENT - 05/26/15 1411    General Information   Behavior/Cognition Alert;Cooperative;Pleasant mood   Cognitive-Linquistic Treatment   Treatment focused on Voice   Skilled Treatment Pt began session clearing throat 5 times in first 2 minutes- SLP cued/reminded pt to drink H2O instead. Pt had 2 throat clears in the remaining session. Pt has remained consistent doing the exercises for vocal adduction. Pt completed HEP without cues. Voicing was WNL with loud "Ha!" but when SLP had pt answer simple questions without any notable change in voicing. Pt had approx 25% success with abdominal breathing (AB) at rest. With re-education and encouragement to think of a "lead apron" on pt's chest her success incr'd to approx 35% at rest. SLP encouraged pt to practice for longer time periods - at least 12 minutes (was  practicing 5-10 minutes instead of 15-20 minutes), twice a day.   Assessment / Recommendations / Plan   Plan Continue with current plan of care   Progression Toward Goals   Progression toward goals Progressing toward goals          SLP Education - 05/26/15 1451    Education provided Yes   Education Details abdomoinal breathing   Person(s) Educated Patient   Methods Demonstration;Explanation;Verbal cues   Comprehension Verbalized understanding;Returned demonstration;Verbal cues required;Need further instruction          SLP Short Term Goals - 05/26/15 1452    SLP SHORT TERM GOAL #1   Title pt will demo HEP with rare min A   Time 3   Period Weeks   Status On-going   SLP SHORT TERM GOAL #2   Title s/z ratio will improve to <1.5   Time 3   Period Weeks   Status On-going   SLP SHORT TERM GOAL #3   Title sustained /a/ will improve to average 7 seconds   Time 3   Period Weeks   Status On-going   SLP SHORT TERM GOAL #4   Title pt will tell SLP more vocally hygenic options to throat clearing   Time 3   Period Weeks   Status On-going   SLP SHORT TERM GOAL #5   Title  pt will demo abdominal breatihng in sentences 75% success   Time 3   Period Weeks   Status On-going          SLP Long Term Goals - 05/26/15 1453    SLP LONG TERM GOAL #1   Title s/z ratio will improve to </= 1.4   Time 7   Period Weeks   Status On-going   SLP LONG TERM GOAL #2   Title pt will improve sustained /a/ average 8 seconds   Time 7   Period Weeks   Status On-going   SLP LONG TERM GOAL #3   Title pt will demo Vocal Handicap Index score of <25   Time 7   Period Weeks   Status On-going   SLP LONG TERM GOAL #4   Title pt will demo more hygenic options other than throat clears in 4 ST sessions   Time 7   Period Weeks   Status On-going   SLP LONG TERM GOAL #5   Title pt will demo abdominal breathing in simple conversation or public speaking-type situation 75% success   Time 7   Period  Weeks   Status On-going          Plan - 05/26/15 1451    Clinical Impression Statement Pt began HEP for abdominal breathing today, with cues needed from SLP. Also taught today was voice hygiene. Pt will cont to benefit from skilled ST to address voice concerns.   Speech Therapy Frequency 2x / week   Duration --  7 weeks   Treatment/Interventions Compensatory strategies;Patient/family education;SLP instruction and feedback   Potential to Achieve Goals Fair   Potential Considerations Severity of impairments   SLP Home Exercise Plan provided today   Consulted and Agree with Plan of Care Patient      Patient will benefit from skilled therapeutic intervention in order to improve the following deficits and impairments:   Other voice and resonance disorders    Problem List Patient Active Problem List   Diagnosis Date Noted  . Paresthesias 07/20/2011  . Hirsutism 11/24/2010  . Menopausal syndrome 11/24/2010  . Screening for malignant neoplasm of the cervix 11/24/2010  . Family history of malignant neoplasm of gastrointestinal tract 06/21/2010  . Annual physical exam 05/14/2010  . IBS (irritable bowel syndrome) 05/14/2010  . HYPERLIPIDEMIA 11/13/2007  . HYPERTENSION 07/20/2006    [facility] ,MS, CCC-SLP  05/26/2015, 2:53 PM  [facility] [address] [place], [place], [address] Phone: [phone]   Fax:  [phone]   Name: Cheyenne Bullock MRN: [id number] Date of Birth: 1960/02/24

## 2015-05-28 ENCOUNTER — Ambulatory Visit: Payer: BC Managed Care – PPO

## 2015-05-28 DIAGNOSIS — R498 Other voice and resonance disorders: Secondary | ICD-10-CM | POA: Diagnosis not present

## 2015-05-28 NOTE — Therapy (Signed)
[facility] [address] [place], [place], [address] Phone: [phone]   Fax:  [phone]  Speech Language Pathology Treatment  Patient Details  Name: Cheyenne Bullock MRN: [id number] Date of Birth: [birth year] No Data Recorded  Encounter Date: 05/28/2015      End of Session - 05/28/15 1024    Visit Number 4   Number of Visits 17   Date for SLP Re-Evaluation 07/17/15   SLP Start Time 0934   SLP Stop Time  1014   SLP Time Calculation (min) 40 min   Activity Tolerance Patient tolerated treatment well      Past Medical History  Diagnosis Date  . Hypertension   . Arthritis   . Hyperlipemia   . Abdominal pain, generalized   . Left thyroid nodule     Past Surgical History  Procedure Laterality Date  . Cesarean section  01/18/91  . Cystectomy      from both ovaries  . Cholecystectomy  11/08  . Uterine ablation  2008    There were no vitals filed for this visit.      Subjective Assessment - 05/28/15 0941    Subjective Pt rates voice 6-7/10 (10=normal voice)   Currently in Pain? No/denies               ADULT SLP TREATMENT - 05/28/15 0942    General Information   Behavior/Cognition Alert;Cooperative;Pleasant mood   Cognitive-Linquistic Treatment   Treatment focused on Voice   Skilled Treatment Pt with abdominal breathing increased in success/frequency since last session - 75% at rest lying down and sitting. In sentence responses pt used abdominal breathing 80% of the time. Pt's s/z and sustained /a/ times improved.   Assessment / Recommendations / Plan   Plan Continue with current plan of care   Progression Toward Goals   Progression toward goals Progressing toward goals            SLP Short Term Goals - 05/28/15 1005    SLP SHORT TERM GOAL #1   Title pt will demo HEP with rare min A   Time 3   Period Weeks   Status On-going   SLP SHORT TERM GOAL #2   Title s/z ratio will improve to  <1.5   Status Achieved  1.3 -  05/28/15   SLP SHORT TERM GOAL #3   Title sustained /a/ will improve to average 7 seconds   Status Achieved  11.5 seconds - 05/28/15   SLP SHORT TERM GOAL #4   Title pt will tell SLP more vocally hygenic options to throat clearing   Status Achieved   SLP SHORT TERM GOAL #5   Title pt will demo abdominal breatihng in sentences 75% success   Status Achieved          SLP Long Term Goals - 05/28/15 1021    SLP LONG TERM GOAL #1   Title s/z ratio will improve to </= 1.4 over 3 sessions   Time 7   Period Weeks   Status Revised   SLP LONG TERM GOAL #2   Title pt will improve sustained /a/ average 8 seconds over three sessions   Time 7   Period Weeks   Status Revised   SLP LONG TERM GOAL #3   Title pt will demo Vocal Handicap Index score of <25   Time 7   Period Weeks   Status On-going   SLP LONG TERM GOAL #4   Title pt will demo  more hygenic options other than throat clears in 4 ST sessions   Time 7   Period Weeks   Status On-going   SLP LONG TERM GOAL #5   Title pt will demo abdominal breathing in simple conversation or public speaking-type situation 75% success   Time 7   Period Weeks   Status On-going          Plan - 05/28/15 1025    Clinical Impression Statement Abdominal breathing today witih more success than last session. Pt will cont to benefit from skilled ST to address voice concerns.   Speech Therapy Frequency 2x / week   Duration --  7 weeks   Treatment/Interventions Compensatory strategies;Patient/family education;SLP instruction and feedback   Potential to Achieve Goals Fair   Potential Considerations Severity of impairments      Patient will benefit from skilled therapeutic intervention in order to improve the following deficits and impairments:   Other voice and resonance disorders    Problem List Patient Active Problem List   Diagnosis Date Noted  . Paresthesias 07/20/2011  . Hirsutism 11/24/2010  .  Menopausal syndrome 11/24/2010  . Screening for malignant neoplasm of the cervix 11/24/2010  . Family history of malignant neoplasm of gastrointestinal tract 06/21/2010  . Annual physical exam 05/14/2010  . IBS (irritable bowel syndrome) 05/14/2010  . HYPERLIPIDEMIA 11/13/2007  . HYPERTENSION 07/20/2006    [facility] ,MS, CCC-SLP  05/28/2015, 10:26 AM  [facility] [address] [place], [place], [address] Phone: [phone]   Fax:  [phone]   Name: Cheyenne Bullock MRN: [id number] Date of Birth: 1960/09/08

## 2015-05-28 NOTE — Patient Instructions (Signed)
  Please complete the assigned speech therapy homework and return it to your next session.  

## 2015-06-03 ENCOUNTER — Ambulatory Visit: Payer: BC Managed Care – PPO

## 2015-06-03 DIAGNOSIS — R498 Other voice and resonance disorders: Secondary | ICD-10-CM

## 2015-06-03 NOTE — Patient Instructions (Signed)
Wear a rubber band or a piece of jewelry you don't normally wear to cue you to cue yourself to continue to use your abdominal breathing in conversation.

## 2015-06-03 NOTE — Therapy (Signed)
[facility] [address] [place], [place], [address] Phone: [phone]   Fax:  [phone]  Speech Language Pathology Treatment  Patient Details  Name: Cheyenne Bullock MRN: [id number] Date of Birth: 29-Sep-1960 No Data Recorded  Encounter Date: 06/03/2015      End of Session - 06/03/15 1048    Visit Number 5   Number of Visits 17   Date for SLP Re-Evaluation 07/17/15   SLP Start Time 90   SLP Stop Time  1100   SLP Time Calculation (min) 41 min   Activity Tolerance Patient tolerated treatment well      Past Medical History  Diagnosis Date  . Hypertension   . Arthritis   . Hyperlipemia   . Abdominal pain, generalized   . Left thyroid nodule     Past Surgical History  Procedure Laterality Date  . Cesarean section  01/18/91  . Cystectomy      from both ovaries  . Cholecystectomy  11/08  . Uterine ablation  2008    There were no vitals filed for this visit.      Subjective Assessment - 06/03/15 1024    Subjective Pt rates voice 7-8/10.    Currently in Pain? No/denies               ADULT SLP TREATMENT - 06/03/15 1025    General Information   Behavior/Cognition Alert;Cooperative;Pleasant mood   Cognitive-Linquistic Treatment   Treatment focused on Voice   Skilled Treatment Slight positive change in pt's voice, in SLP's opinion, from last week. Pt's practiced abdominal breathing 15 minutes BID since last session. Pt is catching herself more frequently throughout the day chest breathing and then corrects her breathing.  Pt drank H2O and popped air through her throat instead of throat clearing during session today. Additionally her s/z ratio today was 1.11, and her sustained /a/ was at average 12 seconds. Pt maintained AB in mod complex conversation over 10 minutes. Pt to cont at once a week. Possible d/c next week due to progress.   Assessment / Recommendations / Plan   Plan Continue with current  plan of care   Progression Toward Goals   Progression toward goals Progressing toward goals          SLP Education - 06/03/15 1127    Education provided Yes   Education Details external cues for abdomianal breathing in conversation   Person(s) Educated Patient   Methods Explanation   Comprehension Verbalized understanding          SLP Short Term Goals - 06/03/15 1049    SLP SHORT TERM GOAL #1   Title pt will demo HEP with rare min A   Time 3   Period Weeks   Status On-going   SLP SHORT TERM GOAL #2   Title s/z ratio will improve to <1.5   Status Achieved  1.3 -  05/28/15   SLP SHORT TERM GOAL #3   Title sustained /a/ will improve to average 7 seconds   Status Achieved  11.5 seconds - 05/28/15   SLP SHORT TERM GOAL #4   Title pt will tell SLP more vocally hygenic options to throat clearing   Status Achieved   SLP SHORT TERM GOAL #5   Title pt will demo abdominal breatihng in sentences 75% success   Status Achieved          SLP Long Term Goals - 06/03/15 1049    SLP LONG TERM GOAL #1  Title s/z ratio will improve to </= 1.4 over 3 sessions   Baseline 06-03-15 2 sessions   Time 6   Period Weeks   Status Revised   SLP LONG TERM GOAL #2   Title pt will improve sustained /a/ average 8 seconds over three sessions   Time 6   Period Weeks   Status Revised   SLP LONG TERM GOAL #3   Title pt will demo Vocal Handicap Index score of <25   Time --   Period --   Status Achieved   SLP LONG TERM GOAL #4   Title pt will demo more hygenic options other than throat clears in 4 ST sessions   Baseline 06-03-15 2 sessions   Time 6   Period Weeks   Status On-going   SLP LONG TERM GOAL #5   Title pt will demo abdominal breathing in simple conversation or public speaking-type situation 75% success   Time --   Period --   Status Achieved          Plan - 06/03/15 1048    Clinical Impression Statement Abdominal breathing today in conversation with more success than last  session. Pt met some LTGs today. See goal update for details. She will cont to benefit from skilled ST to address voice concerns, with a decr to once a week due to progress.   Speech Therapy Frequency 1x /week   Duration --  6 weeks   Treatment/Interventions Compensatory strategies;Patient/family education;SLP instruction and feedback   Potential to Achieve Goals Fair   Potential Considerations Severity of impairments      Patient will benefit from skilled therapeutic intervention in order to improve the following deficits and impairments:   Other voice and resonance disorders    Problem List Patient Active Problem List   Diagnosis Date Noted  . Paresthesias 07/20/2011  . Hirsutism 11/24/2010  . Menopausal syndrome 11/24/2010  . Screening for malignant neoplasm of the cervix 11/24/2010  . Family history of malignant neoplasm of gastrointestinal tract 06/21/2010  . Annual physical exam 05/14/2010  . IBS (irritable bowel syndrome) 05/14/2010  . HYPERLIPIDEMIA 11/13/2007  . HYPERTENSION 07/20/2006    [facility] ,MS, CCC-SLP  06/03/2015, 11:29 AM  [facility] [address] [place], [place], [address] Phone: [phone]   Fax:  [phone]   Name: Cheyenne Bullock MRN: [id number] Date of Birth: Dec 03, 1960

## 2015-06-08 ENCOUNTER — Ambulatory Visit: Payer: BC Managed Care – PPO

## 2015-06-08 DIAGNOSIS — R498 Other voice and resonance disorders: Secondary | ICD-10-CM | POA: Diagnosis not present

## 2015-06-08 NOTE — Patient Instructions (Signed)
Continue with the exercises!

## 2015-06-08 NOTE — Therapy (Signed)
[facility] [address] [place], [place], [address] Phone: [phone]   Fax:  [phone]  Speech Language Pathology Treatment  Patient Details  Name: Cheyenne Bullock MRN: [id number] Date of Birth: 1960-06-27 No Data Recorded  Encounter Date: 06/08/2015      End of Session - 06/08/15 1146    Visit Number 6   Number of Visits 17   Date for SLP Re-Evaluation 07/17/15   SLP Start Time 1103   SLP Stop Time  1144   SLP Time Calculation (min) 41 min   Activity Tolerance Patient tolerated treatment well      Past Medical History  Diagnosis Date  . Hypertension   . Arthritis   . Hyperlipemia   . Abdominal pain, generalized   . Left thyroid nodule     Past Surgical History  Procedure Laterality Date  . Cesarean section  01/18/91  . Cystectomy      from both ovaries  . Cholecystectomy  11/08  . Uterine ablation  2008    There were no vitals filed for this visit.      Subjective Assessment - 06/08/15 1106    Subjective Pt rates her voice 7/10.    Currently in Pain? No/denies               ADULT SLP TREATMENT - 06/08/15 1106    General Information   Behavior/Cognition Alert;Cooperative;Pleasant mood   Cognitive-Linquistic Treatment   Treatment focused on Voice   Skilled Treatment Pt reviewed her HEP with SLP, she req'd min A with vocal adduction exercise. Pulling up with vocalizing produced clear voice during "huh" exercise, however in reps of one to three word answers, no marked difference between other vocalization. Pt did not clear throat during session today and reports she hums or drinks instead of clearing the throat. Her s/z ratio today was 0.75 (WNL) and her sustained /a/ was 10 seconds (slight decr'd from WNL).   Assessment / Recommendations / Plan   Plan Continue with current plan of care  next visit likely d/c   Progression Toward Goals   Progression toward goals Progressing toward goals             SLP Short Term Goals - 06/08/15 1108    SLP SHORT TERM GOAL #1   Title pt will demo HEP with rare min A   Time --   Period --   Status Achieved   SLP SHORT TERM GOAL #2   Title s/z ratio will improve to <1.5   Status Achieved  1.3 -  05/28/15   SLP SHORT TERM GOAL #3   Title sustained /a/ will improve to average 7 seconds   Status Achieved  11.5 seconds - 05/28/15   SLP SHORT TERM GOAL #4   Title pt will tell SLP more vocally hygenic options to throat clearing   Status Achieved   SLP SHORT TERM GOAL #5   Title pt will demo abdominal breatihng in sentences 75% success   Status Achieved          SLP Long Term Goals - 06/08/15 1137    SLP LONG TERM GOAL #1   Title s/z ratio will improve to </= 1.4 over 3 sessions   Status Achieved   SLP LONG TERM GOAL #2   Title pt will improve sustained /a/ average 8 seconds over three sessions   Baseline 06-08-15 two sessions   Time 5   Period Weeks   Status Revised  SLP LONG TERM GOAL #3   Title pt will demo Vocal Handicap Index score of <25   Status Achieved   SLP LONG TERM GOAL #4   Title pt will demo more hygenic options other than throat clears in 4 ST sessions   Status Achieved   SLP LONG TERM GOAL #5   Title pt will demo abdominal breathing in simple conversation or public speaking-type situation 75% success   Status Achieved          Plan - 06/08/15 1147    Clinical Impression Statement Abdominal breathing today in conversation with excellent success over 7 minutes. Pt met more LTGs today. Pt will likely be discharged next session, if consistentcy with HEP is maintained. Voice has seen some minor improvement, in this SLP's opinion, but not exceptionally notable change in vocal quality to date.   Speech Therapy Frequency Biweekly   Duration --  1-2 more visits   Treatment/Interventions Compensatory strategies;Patient/family education;SLP instruction and feedback   Potential to Achieve Goals Fair    Potential Considerations Severity of impairments   Consulted and Agree with Plan of Care Patient      Patient will benefit from skilled therapeutic intervention in order to improve the following deficits and impairments:   Other voice and resonance disorders    Problem List Patient Active Problem List   Diagnosis Date Noted  . Paresthesias 07/20/2011  . Hirsutism 11/24/2010  . Menopausal syndrome 11/24/2010  . Screening for malignant neoplasm of the cervix 11/24/2010  . Family history of malignant neoplasm of gastrointestinal tract 06/21/2010  . Annual physical exam 05/14/2010  . IBS (irritable bowel syndrome) 05/14/2010  . HYPERLIPIDEMIA 11/13/2007  . HYPERTENSION 07/20/2006    [facility] ,MS, CCC-SLP  06/08/2015, 11:50 AM  [facility] [address] [place], [place], [address] Phone: [phone]   Fax:  [phone]   Name: Cheyenne Bullock MRN: [id number] Date of Birth: 1960/07/26

## 2015-06-24 ENCOUNTER — Ambulatory Visit: Payer: BC Managed Care – PPO | Attending: Family Medicine

## 2015-06-24 DIAGNOSIS — R498 Other voice and resonance disorders: Secondary | ICD-10-CM | POA: Insufficient documentation

## 2015-06-24 NOTE — Therapy (Signed)
[facility] [address] [place], [place], [address] Phone: [phone]   Fax:  [phone]  Speech Language Pathology Treatment  Patient Details  Name: Cheyenne Bullock MRN: [id number] Date of Birth: [birth year] Referring Provider: Kathyrn Lass, MD  Encounter Date: 06/24/2015      End of Session - 06/24/15 1516    Visit Number 7   Number of Visits 17   Date for SLP Re-Evaluation 07/17/15   SLP Start Time 1411   SLP Stop Time  1440  pt 11 minutes late   SLP Time Calculation (min) 29 min   Activity Tolerance Patient tolerated treatment well      Past Medical History  Diagnosis Date  . Hypertension   . Arthritis   . Hyperlipemia   . Abdominal pain, generalized   . Left thyroid nodule     Past Surgical History  Procedure Laterality Date  . Cesarean section  01/18/91  . Cystectomy      from both ovaries  . Cholecystectomy  11/08  . Uterine ablation  2008    There were no vitals filed for this visit.      Subjective Assessment - 06/24/15 1413    Subjective Rates her voice 7/10 today. "It started out better this morning but I went on a walk with my allergies and it got worse." (prior to walk 8/10)   Currently in Pain? No/denies               ADULT SLP TREATMENT - 06/24/15 1415    General Information   Behavior/Cognition Alert;Cooperative;Pleasant mood   Cognitive-Linquistic Treatment   Treatment focused on Voice   Skilled Treatment Pt reported when she goes to bed now there is no longer throat clearing necessary and she falls asleep quicker. Pt achieved independence with HEP. Her s/z ratio was 1.14, and her sustained /a/ was measured at 14 seconds. Pt maintained    Assessment / Recommendations / Plan   Plan Discharge SLP treatment due to (comment)  met goals   Progression Toward Goals   Progression toward goals Goals met, education completed, patient discharged from [facility] - 06/24/15 [place] #1   Title pt will demo HEP with rare min A   Status Achieved   SLP SHORT TERM GOAL #2   Title s/z ratio will improve to <1.5   Status Achieved  1.3 -  05/28/15   SLP SHORT TERM GOAL #3   Title sustained /a/ will improve to average 7 seconds   Status Achieved  11.5 seconds - 05/28/15   SLP SHORT TERM GOAL #4   Title pt will tell SLP more vocally hygenic options to throat clearing   Status Achieved   SLP SHORT TERM GOAL #5   Title pt will demo abdominal breatihng in sentences 75% success   Status Achieved          SLP Long Term Goals - 06/24/15 1423    SLP LONG TERM GOAL #1   Title s/z ratio will improve to </= 1.4 over 3 sessions   Status Achieved   SLP LONG TERM GOAL #2   Title pt will improve sustained /a/ average 8 seconds over three sessions   Baseline --   Time --   Period --   Status Achieved   SLP LONG TERM GOAL #3   Title pt will demo  Vocal Handicap Index score of <25   Status Achieved   SLP LONG TERM GOAL #4   Title pt will demo more hygenic options other than throat clears in 4 ST sessions   Status Achieved   SLP LONG TERM GOAL #5   Title pt will demo abdominal breathing in simple conversation or public speaking-type situation 75% success   Status Achieved          Plan - 06/24/15 1517    Clinical Impression Statement Abdominal breathing today in conversation cont'd with excellent success over 5 minutes. Additionally, pt was able to complete HEP with independence. Pt met last LTG today and will  be discharged.    Treatment/Interventions Compensatory strategies;Patient/family education;SLP instruction and feedback   Potential to Achieve Goals Fair   Potential Considerations Severity of impairments   Consulted and Agree with Plan of Care Patient      Patient will benefit from skilled therapeutic intervention in order to improve the following deficits and impairments:   Other voice and resonance  disorders   SPEECH THERAPY DISCHARGE SUMMARY  Visits from Start of Care: 7  Current functional level related to goals / functional outcomes: Pt met STGs and LTGs. Her vocal data improved (sustained /a/, s/z ratio). Pt states her voice quality has improved. SLP has noted minor positive change but marked vocal change was not ID'd during her therapy course. 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 After adduction exercises, pt's vocal quality made limited and short-lived improvement (approx 20 seconds in length), indicating that pt's paretic vocal fold better approximated midline after performing exercises. There was no long-lasting positive effect on pt's voice quality after approx 20 seconds. Pt had excellent success with abdominal breathing.   Remaining deficits: Mod hoarseness.   Education / Equipment: HEP for vocal adduction, abdominal breathing.  Plan: Patient agrees to discharge.  Patient goals were partially met. Patient is being discharged due to meeting the stated rehab goals.  ?????       Problem List Patient Active Problem List   Diagnosis Date Noted  . Paresthesias 07/20/2011  . Hirsutism 11/24/2010  . Menopausal syndrome 11/24/2010  . Screening for malignant neoplasm of the cervix 11/24/2010  . Family history of malignant neoplasm of gastrointestinal tract 06/21/2010  . Annual physical exam 05/14/2010  . IBS (irritable bowel syndrome) 05/14/2010  . HYPERLIPIDEMIA 11/13/2007  . HYPERTENSION 07/20/2006    [facility] ,MS, CCC-SLP   06/24/2015, 3:19 PM  [facility] [address] [place], [place], [address] Phone: [phone]   Fax:  [phone]   Name: Cheyenne Bullock MRN: [id number] Date of Birth: Nov 12, 1960

## 2015-06-24 NOTE — Patient Instructions (Signed)
Continue the exercises until the end of June 2017.

## 2015-11-30 ENCOUNTER — Encounter: Payer: Self-pay | Admitting: Podiatry

## 2015-11-30 ENCOUNTER — Ambulatory Visit (INDEPENDENT_AMBULATORY_CARE_PROVIDER_SITE_OTHER): Payer: BC Managed Care – PPO | Admitting: Podiatry

## 2015-11-30 DIAGNOSIS — L600 Ingrowing nail: Secondary | ICD-10-CM | POA: Diagnosis not present

## 2015-11-30 MED ORDER — CEPHALEXIN 500 MG PO CAPS: 500.0000 mg | ORAL_CAPSULE | Freq: Three times a day (TID) | ORAL | 2 refills | Status: DC

## 2015-12-01 ENCOUNTER — Telehealth: Payer: Self-pay | Admitting: *Deleted

## 2015-12-01 DIAGNOSIS — L600 Ingrowing nail: Secondary | ICD-10-CM | POA: Insufficient documentation

## 2015-12-01 NOTE — Telephone Encounter (Signed)
Pt states she is to get her instructions in [facility], but had questions. Pt asked how to clean the toe after removing the bandaid, could she use the Lavender oiled epsom salt, and how many days should she soak, and would the antibiotic interfere with any of her medication. I told pt that once she removed the bandaid she should soak in the plain epsom salt water twice daily and cover with an antibiotic ointment after each 20 minute soak for 4 week and at about the 4 week time period she would be seeing more drying of the area, at that time she could switch to soaking once daily and covering the area with the antibiotic ointment bandaid after each soak and when in shoes or walking around, other wise could allow the area to air dry. I told pt she could stop the soaks and antibiotic dressings once the area got a dry hard scab without redness, swelling or drainage. Pt states understanding.

## 2015-12-01 NOTE — Patient Instructions (Signed)

## 2015-12-01 NOTE — Progress Notes (Signed)
Subjective:     Patient ID: Cheyenne Bullock, female   DOB: Apr 26, 1960, 55 y.o.   MRN: [id number]  HPI 55 year old female presents the office today for concerns of ingrown toenails to both of her big toes. She states the nails are going down into the skin and she is having a lot of pain to this area daily with pressure in shoes. She denies any drainage or swelling or any redness. She's tried trimmed her toenails but she continues to have pain. No other complaints at this time.   Review of Systems  All other systems reviewed and are negative.      Objective:   Physical Exam General: AAO x3, NAD  Dermatological: There is incurvation of both the medial and lateral nail borders of bilateral hallux with tenderness to palpation. There is no surrounding edema, erythema, drainage or pus. There is no ascending cellulitis. Asymptomatic ingrown toenails of the lesser digits. No edema, erythema or signs of infection.   Vascular: DP and PT pulses are 2/4 bilateral with immedate capillary fill time. Pedal hair growth present. There is no pain with calf compression, swelling, warmth, erythema.   Neruologic: Grossly intact via light touch bilateral. Vibratory intact via tuning fork bilateral. Protective threshold with Semmes Wienstein monofilament intact to all pedal sites bilateral.   Musculoskeletal: Tenderness to both the medial and lateral nail borders to bilateral hallux. No other areas of tenderness bilaterally. No gross boney pedal deformities bilateral. No pain, crepitus, or limitation noted with foot and ankle range of motion bilateral. Muscular strength 5/5 in all groups tested bilateral.  Gait: Unassisted, Nonantalgic.      Assessment:     Symptomatic ingrown toenails bilateral hallux toenails.     Plan:     -Treatment options discussed including all alternatives, risks, and complications -Etiology of symptoms were discussed -At this time, the patient is requesting partial nail removal with  chemical matricectomy to the symptomatic portion of the nail. Risks and complications were discussed with the patient for which they understand and  verbally consent to the procedure. Under sterile conditions a total of 3 mL of a mixture of 2% lidocaine plain and 0.5% Marcaine plain was infiltrated in a hallux block fashion. Once anesthetized, the skin was prepped in sterile fashion. A tourniquet was then applied. Next the medial and lateral aspecst of hallux nail borders was then sharply excised making sure to remove the entire offending nail border. Once the nails were ensured to be removed area was debrided and the underlying skin was intact. There is no purulence identified in the procedure. Next phenol was then applied under standard conditions and copiously irrigated. Silvadene was applied. A dry sterile dressing was applied. After application of the dressing the tourniquet was removed and there is found to be an immediate capillary refill time to the digit. The patient tolerated the procedure well any complications. Post procedure instructions were discussed the patient for which he verbally understood. Follow-up in one week for nail check or sooner if any problems are to arise. Discussed signs/symptoms of infection and directed to call the office immediately should any occur or go directly to the emergency room. In the meantime, encouraged to call the office with any questions, concerns, changes symptoms. -[place]  Celesta Gentile, DPM

## 2015-12-08 ENCOUNTER — Ambulatory Visit (INDEPENDENT_AMBULATORY_CARE_PROVIDER_SITE_OTHER): Payer: BC Managed Care – PPO | Admitting: Podiatry

## 2015-12-08 ENCOUNTER — Encounter: Payer: Self-pay | Admitting: Podiatry

## 2015-12-08 DIAGNOSIS — Z9889 Other specified postprocedural states: Secondary | ICD-10-CM

## 2015-12-08 DIAGNOSIS — L600 Ingrowing nail: Secondary | ICD-10-CM

## 2015-12-08 NOTE — Progress Notes (Signed)
   Subjective:    Patient ID: Cheyenne Bullock, female    DOB: Sep 13, 1960, 55 y.o.   MRN: [id number]  HPI  55 year old female presents the office they for follow-up evaluation of bilateral hallux partial nail avulsions. She states that they are doing well. She had some clear drainage at times with denies any pus. Denies any redness or red streaks. She's been soaking in Epson salts, with antibiotic ointment and a bandage daily. She states that she is doing well. She is also positive for the nails to be trimmed today as they are elongated and causing pressure in her shoes. Denies any redness or drainage from the other toenails.  Review of Systems  All other systems reviewed and are negative.      Objective:   Physical Exam General: AAO x3, NAD  Dermatological: Status post bilateral hallux medial/lateral partial nail avulsions. Areas are healing well and there is a granular tissue present as well as a small amount of scab. There is no surrounding erythema, ascending cellulitis. As no drainage or pus expressed today. Minimal tenderness palpation of the nail procedure sites. Bilateral hallux nails appear to be somewhat elongated causing irritation shoes.  Vascular: Dorsalis Pedis artery and Posterior Tibial artery pedal pulses are 2/4 bilateral with immedate capillary fill time. Pedal hair growth present. There is no pain with calf compression, swelling, warmth, erythema.   Neruologic: Grossly intact via light touch bilateral.  Musculoskeletal: No gross boney pedal deformities bilateral. No pain, crepitus, or limitation noted with foot and ankle range of motion bilateral. Muscular strength 5/5 in all groups tested bilateral.  Gait: Unassisted, Nonantalgic.      Assessment & Plan:  Continue soaking in epsom salts twice a day followed by antibiotic ointment and a band-aid. Can leave uncovered at night. Continue this until completely healed.  Bilateral hallux nails were debrided without  complications or bleeding. If the area has not healed in 2 weeks, call the office for follow-up appointment, or sooner if any problems arise.  Monitor for any signs/symptoms of infection. Call the office immediately if any occur or go directly to the emergency room. Call with any questions/concerns.  Celesta Gentile, DPM

## 2015-12-08 NOTE — Patient Instructions (Signed)

## 2015-12-18 ENCOUNTER — Encounter: Payer: Self-pay | Admitting: Podiatry

## 2015-12-29 ENCOUNTER — Telehealth: Payer: Self-pay | Admitting: *Deleted

## 2015-12-29 NOTE — Telephone Encounter (Addendum)
-----   Message from Trula Slade, DPM sent at 12/29/2015  7:25 AM EST ----- Negative for fungus- can try urea. Left message requesting pt call for results. Pt called for results. 12/30/2015-Informed pt of Dr. Leigh Aurora review of results and recommendations, explained [id number] and AmLactin use of either and spelled each. Pt states she would like a note mailed to her with the instructions. Note mailed to pt.

## 2015-12-30 ENCOUNTER — Encounter: Payer: Self-pay | Admitting: *Deleted

## 2016-01-06 ENCOUNTER — Telehealth: Payer: Self-pay | Admitting: *Deleted

## 2016-01-06 NOTE — Telephone Encounter (Signed)
Pt states she bought the cream for the thick toenails but wanted to know when to start, she still has scabs and when to use, how much and for how long. I informed pt she could use on any affected toenail that did not have scabbing, once a day preferably at night, lightly cover the toenail, and use until she saw the results she wanted. Pt states understanding and asked if this would be life long. I told her the toenail bed was going to grow out the same type of nail, so as the thick or discolored nail grew out she may have to retreat.

## 2016-10-26 ENCOUNTER — Ambulatory Visit: Payer: BC Managed Care – PPO | Attending: Family Medicine

## 2016-10-26 DIAGNOSIS — M6281 Muscle weakness (generalized): Secondary | ICD-10-CM

## 2016-10-26 DIAGNOSIS — G8929 Other chronic pain: Secondary | ICD-10-CM | POA: Insufficient documentation

## 2016-10-26 DIAGNOSIS — R498 Other voice and resonance disorders: Secondary | ICD-10-CM | POA: Diagnosis present

## 2016-10-26 DIAGNOSIS — M25511 Pain in right shoulder: Secondary | ICD-10-CM | POA: Diagnosis present

## 2016-10-26 DIAGNOSIS — M25611 Stiffness of right shoulder, not elsewhere classified: Secondary | ICD-10-CM

## 2016-10-26 NOTE — Therapy (Signed)
[facility] [address] [place], [place], [address] Phone: [phone]   Fax:  [phone]  Physical Therapy Evaluation  Patient Details  Name: Cheyenne Bullock MRN: [id number] Date of Birth: 01-Sep-1960 Referring Provider: Kathyrn Lass, MD  Encounter Date: 10/26/2016      PT End of Session - 10/26/16 1441    Visit Number 1   Date for PT Re-Evaluation 12/21/16   PT Start Time 3235   PT Stop Time 1443   PT Time Calculation (min) 45 min   Activity Tolerance Patient tolerated treatment well   Behavior During Therapy [facility] for tasks assessed/performed      Past Medical History:  Diagnosis Date  . Abdominal pain, generalized   . Arthritis   . Cancer ([place]) 11/12/2013   thyroid removed  . Hyperlipemia   . Hypertension   . Left thyroid nodule     Past Surgical History:  Procedure Laterality Date  . CESAREAN SECTION  01/18/91  . CHOLECYSTECTOMY  11/08  . CYSTECTOMY     from both ovaries  . Uterine Ablation  2008    There were no vitals filed for this visit.       Subjective Assessment - 10/26/16 1403    Subjective Pt is a Rt hand dominant female who presents to PT with >1 year history of Rt shoulder pain with increased pain over the past month. Pt reports that she leaned forward 4 days ago to tie her shoe and had a sharp pain in the Rt shoulder and is now having pain with reaching overhead. Pt reports that her shoulder "catches" with use at times.  No imaging has been done.     Pertinent History thyroid cancer   Diagnostic tests none   Patient Stated Goals reduce Rt shoulder pain with writing and cooking, return to weight training with arms, reaching overhead   Currently in Pain? Yes   Pain Score 7   4-10/10   Pain Location Shoulder   Pain Orientation Right   Pain Descriptors / Indicators Aching;Sore;Stabbing   Pain Type Chronic pain   Pain Onset More than a month ago   Pain Frequency Constant   Aggravating Factors  writing, repetative use, cutting food/cooking   Pain Relieving Factors pain medication, rest            [facility] PT Assessment - 10/26/16 0001      Assessment   Medical Diagnosis Rt shoulder pain   Referring Provider Kathyrn Lass, MD   Onset Date/Surgical Date 10/27/15   Hand Dominance Right   Prior Therapy exercises at home     Precautions   Precautions Other (comment)  thyroid cancer- no [place]     Restrictions   Weight Bearing Restrictions No     Balance Screen   Has the patient fallen in the past 6 months No   Has the patient had a decrease in activity level because of a fear of falling?  No   Is the patient reluctant to leave their home because of a fear of falling?  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   Leisure walking, exercise, reading, movies     Cognition   Overall Cognitive Status Within  Functional Limits for tasks assessed     Observation/Other Assessments   Focus on Therapeutic Outcomes (FOTO)  41% limitation     Posture/Postural Control   Posture/Postural Control Postural limitations   Postural Limitations Forward head;Rounded Shoulders     ROM / Strength   AROM / PROM / Strength AROM;PROM;Strength     AROM   Overall AROM  Deficits   Overall AROM Comments Lt shoulder A/ROM is full   AROM Assessment Site Shoulder   Right/Left Shoulder Right   Right Shoulder Flexion 90 Degrees  pain   Right Shoulder ABduction 70 Degrees  pain   Right Shoulder Internal Rotation --  Rt=Lt to T9 without pain   Right Shoulder External Rotation --  to T1 with substituation     PROM   Overall PROM  Within functional limits for tasks performed   Overall PROM Comments Rt and Lt shoulder AROM is full.  No  pain with PROM on the Rt     Strength   Overall Strength Deficits   Strength Assessment Site Shoulder;Elbow   Right/Left Shoulder Right;Left   Right Shoulder Flexion 3+/5   Right Shoulder ABduction 3+/5   Right Shoulder Internal Rotation 4-/5   Right Shoulder External Rotation 4-/5   Left Shoulder Flexion 4+/5   Left Shoulder Extension 4+/5   Left Shoulder ABduction 4+/5   Left Shoulder Internal Rotation 4+/5   Left Shoulder External Rotation 4+/5   Right/Left Elbow Right;Left   Right Elbow Flexion 4/5   Left Elbow Flexion 4+/5     Palpation   Palpation comment palpable tenderness over Rt biceps muscle belly and anterior glenohumeral joint     Special Tests    Special Tests Rotator Cuff Impingement   Rotator Cuff Impingment tests Neer impingement test;Empty Can test     Neer Impingement test    Findings Negative   Side Right     Empty Can test   Findings Negative   Side Right            Objective measurements completed on examination: See above findings.                  PT Education - 10/26/16 1440    Education provided Yes   Education Details scapular squeezes   Person(s) Educated Patient   Methods Explanation;Handout   Comprehension Verbalized understanding;Returned demonstration          PT Short Term Goals - 10/26/16 1419      PT SHORT TERM GOAL #1   Title be independent in initial HEP   Time 4   Period Weeks   Status New   Target Date 11/23/16     PT SHORT TERM GOAL #2   Title report a 30% reduction in Rt shoulder pain with writing and use   Time 4   Status New   Target Date 11/23/16     PT SHORT TERM GOAL #3   Title demonstrate Rt shoulder A/ROM to > 120 degrees to allow for reaching overhead   Time 4   Period Weeks   Status New   Target Date 11/23/16     PT SHORT TERM GOAL #4   Title demonstrate Rt shoulder flexion > or = to 4/5 to allow for lifting overhead   Time 4   Period Weeks   Status New   Target Date  11/23/16           PT Long Term Goals - 10/26/16 1418  PT LONG TERM GOAL #1   Title be indepdendent in advanced HEP   Time 8   Period Weeks   Status New   Target Date 12/21/16     PT LONG TERM GOAL #2   Title reduce FOTO to < or = to 29% limitation   Time 8   Period Weeks   Status New   Target Date 12/21/16     PT LONG TERM GOAL #3   Title demonstrate Rt shoulder A/ROM flexion to > or = to 140 degrees to improve overhead reaching   Time 8   Period Weeks   Status New   Target Date 12/21/16     PT LONG TERM GOAL #4   Title demonstrate > or = to 4+/5 Rt shoulder strength to improve lifting and use with endurance activities   Time 8   Period Weeks   Status New   Target Date 12/21/16     PT LONG TERM GOAL #5   Title report < or = to 4/10 Rt shoulder pain with writing and use   Time 8   Period Weeks   Status New   Target Date 12/21/16     Additional Long Term Goals   Additional Long Term Goals Yes     PT LONG TERM GOAL #6   Title reutrn to weight lifting with upper body weights without limitation   Time 8   Period Weeks   Status New   Target Date 12/21/16                Plan - 10/26/16 1545    Clinical Impression Statement Pt is a Rt hand dominant female who presents to PT with a 1 year history of Rt shoulder pain with worsening symptoms over the past month.  Pt with significant increase in pain over the past 4 days after she reached forward to tie her shoe and felt a sharp pain in the front of her shoulder.  Pt now demonstrates limited A/ROM due to pain.  PROM is full without pain.  Pt with reduced rotator cuff strength with pain reported with resisted testing.  Pt reports 7-10/10 Rt shoulder pain with use especially with reaching against gravity.  Pt with rounded shoulder posture in sitting.  Pt with palpable tenderness over Rt biceps muscle belly and anterior glenohumeral joint.  Pt will benefit from skilled PT for Rt shoulder A/ROM, strength,  postural stabilization and pain management.     History and Personal Factors relevant to plan of care: none that will impact PT   Clinical Presentation Evolving   Clinical Presentation due to: increase pain over the past month   Clinical Decision Making Low   Rehab Potential Good   PT Frequency 2x / week   PT Duration 8 weeks   PT Treatment/Interventions ADLs/Self Care Home Management;Cryotherapy;Functional mobility training;Therapeutic activities;Therapeutic exercise;Neuromuscular re-education;Patient/family education;Passive range of motion;Manual techniques;Dry needling;Taping   PT Next Visit Plan Rt shoulder A/ROM, gentle strength, postural strength, endurance, pain management   Consulted and Agree with Plan of Care Patient      Patient will benefit from skilled therapeutic intervention in order to improve the following deficits and impairments:  Postural dysfunction, Decreased strength, Impaired flexibility, Pain, Impaired UE functional use, Decreased endurance, Decreased range of motion, Decreased activity tolerance  Visit Diagnosis: Chronic right shoulder pain - Plan: PT plan of care cert/re-cert  Stiffness of right shoulder, not elsewhere classified - Plan: PT plan of care cert/re-cert  Muscle weakness (generalized) -  Plan: PT plan of care cert/re-cert     Problem List Patient Active Problem List   Diagnosis Date Noted  . Ingrown toenail 12/01/2015  . Dysphonia 11/05/2013  . Papillary carcinoma of thyroid ([place]) 11/04/2013  . Paresthesia 07/20/2011  . Hirsutism 11/24/2010  . Menopausal syndrome 11/24/2010  . Screening for malignant neoplasm of cervix 11/24/2010  . Family history of malignant neoplasm of gastrointestinal tract 06/21/2010  . Encounter for general adult medical examination without abnormal findings 05/14/2010  . Irritable bowel syndrome 05/14/2010  . HYPERLIPIDEMIA 11/13/2007  . HYPERTENSION 07/20/2006    Sigurd Sos, PT 10/26/16 4:05 PM  [facility] [address] [place], [place], [address] Phone: [phone]   Fax:  [phone]  Name: Cheyenne Bullock MRN: [id number] Date of Birth: 1960-08-28

## 2016-10-26 NOTE — Patient Instructions (Addendum)
  Scapular Retraction (Standing)   With arms at sides, pinch shoulder blades together.  Hold 5 seconds. Repeat __10__ times per set. Many times a day.   http://orth.exer.us/944   Copyright  VHI. All rights reserved.  Chin Protraction / Retraction   Slide head forward keeping chin level. Slide head back, pulling chin in. Hold each position _5__ seconds. Repeat _10__ times. Do _many__ sessions per day.  Copyright  VHI. All rights reserved.  Hold all stretches 5-10 seconds and perform 5-10 times, 3 times a day.   Slide right arm up wall, with  PALM FACING THE WALL, by leaning toward wall.  Copyright  VHI. All rights reserved.   Sitting upright, slide forearm forward along table, bending from the waist until a stretch is felt. Copyright  VHI. All rights reserved.    Clasp hands together and raise arms above head, keeping elbows as straight as possible. Can be done sitting or lying.   Copyright  VHI. All rights reserved.    [place] [address], [place], [place] [address] Phone # [phone] Fax [phone]

## 2016-11-02 ENCOUNTER — Ambulatory Visit: Payer: BC Managed Care – PPO

## 2016-11-02 DIAGNOSIS — M25611 Stiffness of right shoulder, not elsewhere classified: Secondary | ICD-10-CM

## 2016-11-02 DIAGNOSIS — M25511 Pain in right shoulder: Secondary | ICD-10-CM | POA: Diagnosis not present

## 2016-11-02 DIAGNOSIS — M6281 Muscle weakness (generalized): Secondary | ICD-10-CM

## 2016-11-02 DIAGNOSIS — G8929 Other chronic pain: Secondary | ICD-10-CM

## 2016-11-02 NOTE — Therapy (Signed)
[facility] [address] [place], [place], [address] Phone: [phone]   Fax:  [phone]  Physical Therapy Treatment  Patient Details  Name: Cheyenne Bullock MRN: [id number] Date of Birth: Aug 08, 1960 Referring Provider: Kathyrn Lass, MD  Encounter Date: 11/02/2016      PT End of Session - 11/02/16 1613    Visit Number 2   Date for PT Re-Evaluation 12/21/16   PT Start Time 4332   PT Stop Time 1613   PT Time Calculation (min) 42 min   Activity Tolerance Patient tolerated treatment well   Behavior During Therapy [facility] for tasks assessed/performed      Past Medical History:  Diagnosis Date  . Abdominal pain, generalized   . Arthritis   . Cancer ([place]) 11/12/2013   thyroid removed  . Hyperlipemia   . Hypertension   . Left thyroid nodule     Past Surgical History:  Procedure Laterality Date  . CESAREAN SECTION  01/18/91  . CHOLECYSTECTOMY  11/08  . CYSTECTOMY     from both ovaries  . Uterine Ablation  2008    There were no vitals filed for this visit.      Subjective Assessment - 11/02/16 1540    Subjective I now have pain in the front of the shoulder, no longer at my biceps area.     Currently in Pain? Yes   Pain Score 4    Pain Location Shoulder   Pain Orientation Right   Pain Descriptors / Indicators Aching;Sore   Pain Onset More than a month ago   Pain Frequency Constant   Aggravating Factors  wristing, repetative use, reaching, vacuuming   Pain Relieving Factors pain medication, rest                         [facility] Adult PT Treatment/Exercise - 11/02/16 0001      Exercises   Exercises Shoulder;Elbow     Shoulder Exercises: Supine   Other Supine Exercises overehead flexion with clasped hands x 10, cane flexion x10     Shoulder Exercises: Seated   Other Seated Exercises UE ranger: flexion and horizontal abduction x 20 each     Shoulder Exercises: Standing   Extension  Strengthening;Both;Theraband;20 reps   Theraband Level (Shoulder Extension) Level 1 (Yellow)   Row Strengthening;Both;20 reps;Theraband   Theraband Level (Shoulder Row) Level 1 (Yellow)   Other Standing Exercises finger ladderx 10     Shoulder Exercises: Pulleys   Flexion 3 minutes     Manual Therapy   Manual Therapy Passive ROM   Manual therapy comments P/ROM of Rt shoulder in all directions- no pain                  PT Short Term Goals - 10/26/16 1419      PT SHORT TERM GOAL #1   Title be independent in initial HEP   Time 4   Period Weeks   Status New   Target Date 11/23/16     PT SHORT TERM GOAL #2   Title report a 30% reduction in Rt shoulder pain with writing and use   Time 4   Status New   Target Date 11/23/16     PT SHORT TERM GOAL #3   Title demonstrate Rt shoulder A/ROM to > 120 degrees to allow for reaching overhead   Time 4   Period Weeks   Status New   Target Date 11/23/16  PT SHORT TERM GOAL #4   Title demonstrate Rt shoulder flexion > or = to 4/5 to allow for lifting overhead   Time 4   Period Weeks   Status New   Target Date 11/23/16           PT Long Term Goals - 10/26/16 1418      PT LONG TERM GOAL #1   Title be indepdendent in advanced HEP   Time 8   Period Weeks   Status New   Target Date 12/21/16     PT LONG TERM GOAL #2   Title reduce FOTO to < or = to 29% limitation   Time 8   Period Weeks   Status New   Target Date 12/21/16     PT LONG TERM GOAL #3   Title demonstrate Rt shoulder A/ROM flexion to > or = to 140 degrees to improve overhead reaching   Time 8   Period Weeks   Status New   Target Date 12/21/16     PT LONG TERM GOAL #4   Title demonstrate > or = to 4+/5 Rt shoulder strength to improve lifting and use with endurance activities   Time 8   Period Weeks   Status New   Target Date 12/21/16     PT LONG TERM GOAL #5   Title report < or = to 4/10 Rt shoulder pain with writing and use   Time 8    Period Weeks   Status New   Target Date 12/21/16     Additional Long Term Goals   Additional Long Term Goals Yes     PT LONG TERM GOAL #6   Title reutrn to weight lifting with upper body weights without limitation   Time 8   Period Weeks   Status New   Target Date 12/21/16               Plan - 11/02/16 1542    Clinical Impression Statement Pt with 3-4/10 Rt anterior shoulder pain and the pain is no longer stabbing.  Pt with improved ability to move Rt arm against gravity into flexion.  Pt able to tolerate all exercise against gravity and scapular strength exercises.  Pt will continue to benefit from skilled PT for shoulder A/ROM, gentle strength and return to use.    Rehab Potential Good   PT Frequency 2x / week   PT Duration 8 weeks   PT Treatment/Interventions ADLs/Self Care Home Management;Cryotherapy;Functional mobility training;Therapeutic activities;Therapeutic exercise;Neuromuscular re-education;Patient/family education;Passive range of motion;Manual techniques;Dry needling;Taping   PT Next Visit Plan Rt shoulder A/ROM, gentle strength, postural strength, endurance, pain management   Consulted and Agree with Plan of Care Patient      Patient will benefit from skilled therapeutic intervention in order to improve the following deficits and impairments:  Postural dysfunction, Decreased strength, Impaired flexibility, Pain, Impaired UE functional use, Decreased endurance, Decreased range of motion, Decreased activity tolerance  Visit Diagnosis: Chronic right shoulder pain  Stiffness of right shoulder, not elsewhere classified  Muscle weakness (generalized)     Problem List Patient Active Problem List   Diagnosis Date Noted  . Ingrown toenail 12/01/2015  . Dysphonia 11/05/2013  . Papillary carcinoma of thyroid ([place]) 11/04/2013  . Paresthesia 07/20/2011  . Hirsutism 11/24/2010  . Menopausal syndrome 11/24/2010  . Screening for malignant neoplasm of cervix  11/24/2010  . Family history of malignant neoplasm of gastrointestinal tract 06/21/2010  . Encounter for general adult medical examination without abnormal  findings 05/14/2010  . Irritable bowel syndrome 05/14/2010  . HYPERLIPIDEMIA 11/13/2007  . HYPERTENSION 07/20/2006     Sigurd Sos, PT 11/02/16 4:14 PM  [facility] [address], [place], [place], [address] Phone: [phone]   Fax:  [phone]  Name: Cheyenne Bullock MRN: [id number] Date of Birth: 09/04/1960

## 2016-11-07 ENCOUNTER — Encounter: Payer: Self-pay | Admitting: Physical Therapy

## 2016-11-07 ENCOUNTER — Ambulatory Visit: Payer: BC Managed Care – PPO | Admitting: Physical Therapy

## 2016-11-07 DIAGNOSIS — M25611 Stiffness of right shoulder, not elsewhere classified: Secondary | ICD-10-CM

## 2016-11-07 DIAGNOSIS — M6281 Muscle weakness (generalized): Secondary | ICD-10-CM

## 2016-11-07 DIAGNOSIS — R498 Other voice and resonance disorders: Secondary | ICD-10-CM

## 2016-11-07 DIAGNOSIS — G8929 Other chronic pain: Secondary | ICD-10-CM

## 2016-11-07 DIAGNOSIS — M25511 Pain in right shoulder: Secondary | ICD-10-CM | POA: Diagnosis not present

## 2016-11-07 NOTE — Therapy (Signed)
[facility] [address], [place], [place], [address] Phone: [phone]   Fax:  [phone]  Physical Therapy Treatment  Patient Details  Name: Talene Glastetter MRN: [id number] Date of Birth: 03-07-1960 Referring Provider: Kathyrn Lass, MD  Encounter Date: 11/07/2016      PT End of Session - 11/07/16 1407    Visit Number 3   Date for PT Re-Evaluation 12/21/16   PT Start Time 1404   PT Stop Time 1442   PT Time Calculation (min) 38 min   Activity Tolerance Patient tolerated treatment well   Behavior During Therapy [facility] for tasks assessed/performed      Past Medical History:  Diagnosis Date  . Abdominal pain, generalized   . Arthritis   . Cancer ([place]) 11/12/2013   thyroid removed  . Hyperlipemia   . Hypertension   . Left thyroid nodule     Past Surgical History:  Procedure Laterality Date  . CESAREAN SECTION  01/18/91  . CHOLECYSTECTOMY  11/08  . CYSTECTOMY     from both ovaries  . Uterine Ablation  2008    There were no vitals filed for this visit.      Subjective Assessment - 11/07/16 1408    Subjective Overall some better, the arm feels better when I bend over to tie my shoes.    Currently in Pain? No/denies  Just a tight feeling in my R tupper arm.    Multiple Pain Sites No            [facility] PT Assessment - 11/07/16 0001      AROM   Right Shoulder Flexion 135 Degrees   Right Shoulder ABduction 135 Degrees                     [facility] Adult PT Treatment/Exercise - 11/07/16 0001      Shoulder Exercises: Seated   External Rotation Strengthening;Both;20 reps;Theraband   Theraband Level (Shoulder External Rotation) Level 1 (Yellow)     Shoulder Exercises: Standing   Flexion AAROM;Right;10 reps  reaches 18   Extension AAROM;Left;20 reps;Theraband   Theraband Level (Shoulder Extension) Level 1 (Yellow)   Row Strengthening;Right;20 reps;Theraband   Theraband Level (Shoulder  Row) Level 1 (Yellow)   Other Standing Exercises UE ranger flexion 2x 10     Shoulder Exercises: Pulleys   Flexion 3 minutes     Shoulder Exercises: ROM/Strengthening   UBE (Upper Arm Bike) L0 reverse 14min                PT Education - 11/07/16 1424    Education provided Yes   Education Details Yellow tband scap attached ex for HEP   Person(s) Educated Patient   Methods Explanation;Demonstration;Tactile cues;Verbal cues;Handout   Comprehension Verbalized understanding;Returned demonstration          PT Short Term Goals - 11/07/16 1417      PT SHORT TERM GOAL #1   Title be independent in initial HEP   Time 4   Period Weeks   Status Achieved     PT SHORT TERM GOAL #2   Title report a 30% reduction in Rt shoulder pain with writing and use   Time 4   Status Achieved  90%     PT SHORT TERM GOAL #3   Title demonstrate Rt shoulder A/ROM to > 120 degrees to allow for reaching overhead   Time 4   Period Weeks   Status Achieved  PT Long Term Goals - 10/26/16 1418      PT LONG TERM GOAL #1   Title be indepdendent in advanced HEP   Time 8   Period Weeks   Status New   Target Date 12/21/16     PT LONG TERM GOAL #2   Title reduce FOTO to < or = to 29% limitation   Time 8   Period Weeks   Status New   Target Date 12/21/16     PT LONG TERM GOAL #3   Title demonstrate Rt shoulder A/ROM flexion to > or = to 140 degrees to improve overhead reaching   Time 8   Period Weeks   Status New   Target Date 12/21/16     PT LONG TERM GOAL #4   Title demonstrate > or = to 4+/5 Rt shoulder strength to improve lifting and use with endurance activities   Time 8   Period Weeks   Status New   Target Date 12/21/16     PT LONG TERM GOAL #5   Title report < or = to 4/10 Rt shoulder pain with writing and use   Time 8   Period Weeks   Status New   Target Date 12/21/16     Additional Long Term Goals   Additional Long Term Goals Yes     PT LONG TERM GOAL  #6   Title reutrn to weight lifting with upper body weights without limitation   Time 8   Period Weeks   Status New   Target Date 12/21/16               Plan - 11/07/16 1408    Clinical Impression Statement Pt presented without pain but a "tight" feeling in her arm. Pt demonstrated an improved AROM in her RT shoulder, see measurements in chart. Pt had no pain with any movements/exercises, in fact she demonstrated good GH mechanics and good strength using the yellow tband. She was given the band exercises for HEP today.    Rehab Potential Good   PT Frequency 2x / week   PT Duration 8 weeks   PT Treatment/Interventions ADLs/Self Care Home Management;Cryotherapy;Functional mobility training;Therapeutic activities;Therapeutic exercise;Neuromuscular re-education;Patient/family education;Passive range of motion;Manual techniques;Dry needling;Taping   PT Next Visit Plan Rt shoulder A/ROM, gentle strength, postural strength, endurance, pain management   Consulted and Agree with Plan of Care Patient      Patient will benefit from skilled therapeutic intervention in order to improve the following deficits and impairments:  Postural dysfunction, Decreased strength, Impaired flexibility, Pain, Impaired UE functional use, Decreased endurance, Decreased range of motion, Decreased activity tolerance  Visit Diagnosis: Chronic right shoulder pain  Stiffness of right shoulder, not elsewhere classified  Muscle weakness (generalized)  Other voice and resonance disorders     Problem List Patient Active Problem List   Diagnosis Date Noted  . Ingrown toenail 12/01/2015  . Dysphonia 11/05/2013  . Papillary carcinoma of thyroid ([place]) 11/04/2013  . Paresthesia 07/20/2011  . Hirsutism 11/24/2010  . Menopausal syndrome 11/24/2010  . Screening for malignant neoplasm of cervix 11/24/2010  . Family history of malignant neoplasm of gastrointestinal tract 06/21/2010  . Encounter for general adult  medical examination without abnormal findings 05/14/2010  . Irritable bowel syndrome 05/14/2010  . HYPERLIPIDEMIA 11/13/2007  . HYPERTENSION 07/20/2006    Lasundra Hascall, PTA 11/07/2016, 2:47 PM  [facility] [address] [place], [place], [address] Phone: [phone]   Fax:  [phone]  Name: Dasja Brase MRN: [id number] Date of Birth: [birth year]

## 2016-11-07 NOTE — Patient Instructions (Signed)
EXTENSION: Standing - Resistance Band: Stable (Active)   Stand, right arm at side.Hold the bands in your hands not like in the picture.  Against yellow resistance band, draw arm backward, as far as possible, keeping elbow straight. Complete _2__ sets of _10__ repetitions. Perform _2__ sessions per day.  .  http://ss.exer.us/290   Copyright  VHI. All rights reserved.  Resistive Band Rowing   With resistive band anchored in door, grasp both ends. Keeping elbows bent, pull back, squeezing shoulder blades together. Hold __2__ seconds. Repeat _2 x10 ___ times. Do __2__ sessions per day.  http://gt2.exer.us/97   Copyright  VHI. All rights reserved.   External Rotation (Resistive Band)    Elbow bent at right angle, and held firmly against side. Have palms up and hold onto the band gently instead of how the pictures look. Pull  arms outward. Hold _1___ seconds. Repeat __10x 2 _ times. Do __2__ sessions per day.  Copyright  VHI. All rights reserved.

## 2016-11-09 ENCOUNTER — Ambulatory Visit: Payer: BC Managed Care – PPO | Admitting: Physical Therapy

## 2016-11-09 ENCOUNTER — Encounter: Payer: Self-pay | Admitting: Physical Therapy

## 2016-11-09 DIAGNOSIS — R498 Other voice and resonance disorders: Secondary | ICD-10-CM

## 2016-11-09 DIAGNOSIS — M25511 Pain in right shoulder: Principal | ICD-10-CM

## 2016-11-09 DIAGNOSIS — G8929 Other chronic pain: Secondary | ICD-10-CM

## 2016-11-09 DIAGNOSIS — M6281 Muscle weakness (generalized): Secondary | ICD-10-CM

## 2016-11-09 DIAGNOSIS — M25611 Stiffness of right shoulder, not elsewhere classified: Secondary | ICD-10-CM

## 2016-11-09 NOTE — Therapy (Signed)
[facility] [address] [place], [place], [address] Phone: [phone]   Fax:  [phone]  Physical Therapy Treatment  Patient Details  Name: Cheyenne Bullock MRN: [id number] Date of Birth: [birth year] Referring Provider: Kathyrn Lass, MD  Encounter Date: 11/09/2016      PT End of Session - 11/09/16 1103    Visit Number 4   Date for PT Re-Evaluation 12/21/16   PT Start Time 1103   PT Stop Time 1142   PT Time Calculation (min) 39 min   Activity Tolerance Patient tolerated treatment well   Behavior During Therapy [facility] for tasks assessed/performed      Past Medical History:  Diagnosis Date  . Abdominal pain, generalized   . Arthritis   . Cancer ([place]) 11/12/2013   thyroid removed  . Hyperlipemia   . Hypertension   . Left thyroid nodule     Past Surgical History:  Procedure Laterality Date  . CESAREAN SECTION  01/18/91  . CHOLECYSTECTOMY  11/08  . CYSTECTOMY     from both ovaries  . Uterine Ablation  2008    There were no vitals filed for this visit.      Subjective Assessment - 11/09/16 1104    Subjective My arm felt good after last session. I am tender in the front of the shoulder/chest when I reach behind me.    Currently in Pain? No/denies   Multiple Pain Sites No                         [facility] Adult PT Treatment/Exercise - 11/09/16 0001      Shoulder Exercises: Standing   Flexion AROM;Strengthening;Right;10 reps;Weights  1# to wrist   ABduction AROM;Strengthening;Right;10 reps;Weights  1#   Extension Strengthening;Right;20 reps;Theraband   Theraband Level (Shoulder Extension) Level 1 (Yellow)   Row Strengthening;Both;20 reps;Theraband   Theraband Level (Shoulder Row) Level 2 (Red)   Other Standing Exercises UE ranger flexion 2x 10  Added 1# to wrist     Shoulder Exercises: Pulleys   Flexion 3 minutes     Shoulder Exercises: ROM/Strengthening   UBE (Upper Arm Bike) L1  reverse 65min     Neck Exercises: Stretches   Upper Trapezius Stretch --  3 x 20 sec using theracane for over pressure                   PT Short Term Goals - 11/07/16 1417      PT SHORT TERM GOAL #1   Title be independent in initial HEP   Time 4   Period Weeks   Status Achieved     PT SHORT TERM GOAL #2   Title report a 30% reduction in Rt shoulder pain with writing and use   Time 4   Status Achieved  90%     PT SHORT TERM GOAL #3   Title demonstrate Rt shoulder A/ROM to > 120 degrees to allow for reaching overhead   Time 4   Period Weeks   Status Achieved           PT Long Term Goals - 10/26/16 1418      PT LONG TERM GOAL #1   Title be indepdendent in advanced HEP   Time 8   Period Weeks   Status New   Target Date 12/21/16     PT LONG TERM GOAL #2   Title reduce FOTO to < or = to 29% limitation  Time 8   Period Weeks   Status New   Target Date 12/21/16     PT LONG TERM GOAL #3   Title demonstrate Rt shoulder A/ROM flexion to > or = to 140 degrees to improve overhead reaching   Time 8   Period Weeks   Status New   Target Date 12/21/16     PT LONG TERM GOAL #4   Title demonstrate > or = to 4+/5 Rt shoulder strength to improve lifting and use with endurance activities   Time 8   Period Weeks   Status New   Target Date 12/21/16     PT LONG TERM GOAL #5   Title report < or = to 4/10 Rt shoulder pain with writing and use   Time 8   Period Weeks   Status New   Target Date 12/21/16     Additional Long Term Goals   Additional Long Term Goals Yes     PT LONG TERM GOAL #6   Title reutrn to weight lifting with upper body weights without limitation   Time 8   Period Weeks   Status New   Target Date 12/21/16               Plan - 11/09/16 1103    Clinical Impression Statement Pt has not had much pain since last session. Her main complaint is a tightness in the front of the shoulder which appears now when she reaches behind her. Pt  can reach to her bra stap line. Tried red band and light weights on the finger ladder to work towards gentle strengthening which she did very well with. Progressing towards all goals.    Rehab Potential Good   PT Frequency 2x / week   PT Duration 8 weeks   PT Treatment/Interventions ADLs/Self Care Home Management;Cryotherapy;Functional mobility training;Therapeutic activities;Therapeutic exercise;Neuromuscular re-education;Patient/family education;Passive range of motion;Manual techniques;Dry needling;Taping   PT Next Visit Plan Rt shoulder A/ROM, gentle strength, postural strength, endurance, pain management   Consulted and Agree with Plan of Care Patient      Patient will benefit from skilled therapeutic intervention in order to improve the following deficits and impairments:  Postural dysfunction, Decreased strength, Impaired flexibility, Pain, Impaired UE functional use, Decreased endurance, Decreased range of motion, Decreased activity tolerance  Visit Diagnosis: Chronic right shoulder pain  Stiffness of right shoulder, not elsewhere classified  Muscle weakness (generalized)  Other voice and resonance disorders     Problem List Patient Active Problem List   Diagnosis Date Noted  . Ingrown toenail 12/01/2015  . Dysphonia 11/05/2013  . Papillary carcinoma of thyroid ([place]) 11/04/2013  . Paresthesia 07/20/2011  . Hirsutism 11/24/2010  . Menopausal syndrome 11/24/2010  . Screening for malignant neoplasm of cervix 11/24/2010  . Family history of malignant neoplasm of gastrointestinal tract 06/21/2010  . Encounter for general adult medical examination without abnormal findings 05/14/2010  . Irritable bowel syndrome 05/14/2010  . HYPERLIPIDEMIA 11/13/2007  . HYPERTENSION 07/20/2006    Cheyenne Bullock, PTA 11/09/2016, 11:44 AM  [facility] [address] [place], [place], [address] Phone: [phone]   Fax:   [phone]  Name: Cheyenne Bullock MRN: [id number] Date of Birth: 08/13/1960

## 2016-11-14 ENCOUNTER — Ambulatory Visit: Payer: BC Managed Care – PPO | Admitting: Physical Therapy

## 2016-11-14 ENCOUNTER — Encounter: Payer: Self-pay | Admitting: Physical Therapy

## 2016-11-14 DIAGNOSIS — M25511 Pain in right shoulder: Secondary | ICD-10-CM | POA: Diagnosis not present

## 2016-11-14 DIAGNOSIS — M6281 Muscle weakness (generalized): Secondary | ICD-10-CM

## 2016-11-14 DIAGNOSIS — G8929 Other chronic pain: Secondary | ICD-10-CM

## 2016-11-14 DIAGNOSIS — M25611 Stiffness of right shoulder, not elsewhere classified: Secondary | ICD-10-CM

## 2016-11-14 NOTE — Therapy (Signed)
[facility] [address] [place], [place], [address] Phone: [phone]   Fax:  [phone]  Physical Therapy Treatment  Patient Details  Name: Cheyenne Bullock MRN: [id number] Date of Birth: 1960-01-25 Referring Provider: Kathyrn Lass, MD  Encounter Date: 11/14/2016      PT End of Session - 11/14/16 1103    Visit Number 5   Date for PT Re-Evaluation 12/21/16   PT Start Time 1101   PT Stop Time 1140   PT Time Calculation (min) 39 min   Activity Tolerance Patient tolerated treatment well   Behavior During Therapy [facility] for tasks assessed/performed      Past Medical History:  Diagnosis Date  . Abdominal pain, generalized   . Arthritis   . Cancer ([place]) 11/12/2013   thyroid removed  . Hyperlipemia   . Hypertension   . Left thyroid nodule     Past Surgical History:  Procedure Laterality Date  . CESAREAN SECTION  01/18/91  . CHOLECYSTECTOMY  11/08  . CYSTECTOMY     from both ovaries  . Uterine Ablation  2008    There were no vitals filed for this visit.      Subjective Assessment - 11/14/16 1104    Subjective Worsening pain since last session. It was like back to the beginning. When you do nothing it feels better but when she exercises or does anything the pain begins.    Currently in Pain? Yes   Pain Score 5    Pain Location Shoulder   Pain Orientation Right;Left   Multiple Pain Sites No  Bil shoulders                         [facility] Adult PT Treatment/Exercise - 11/14/16 0001      Shoulder Exercises: Supine   Flexion AAROM;Both;5 reps  Painful so stopped     Shoulder Exercises: Standing   Flexion AAROM;Right;5 reps  no wts     Shoulder Exercises: Pulleys   Flexion 3 minutes     Manual Therapy   Soft tissue mobilization Biceps, ant shoulder, pectorals   Myofascial Release RT bicep, anterior shoulder, pecs                  PT Short Term Goals - 11/07/16 1417       PT SHORT TERM GOAL #1   Title be independent in initial HEP   Time 4   Period Weeks   Status Achieved     PT SHORT TERM GOAL #2   Title report a 30% reduction in Rt shoulder pain with writing and use   Time 4   Status Achieved  90%     PT SHORT TERM GOAL #3   Title demonstrate Rt shoulder A/ROM to > 120 degrees to allow for reaching overhead   Time 4   Period Weeks   Status Achieved           PT Long Term Goals - 10/26/16 1418      PT LONG TERM GOAL #1   Title be indepdendent in advanced HEP   Time 8   Period Weeks   Status New   Target Date 12/21/16     PT LONG TERM GOAL #2   Title reduce FOTO to < or = to 29% limitation   Time 8   Period Weeks   Status New   Target Date 12/21/16     PT LONG TERM GOAL #  3   Title demonstrate Rt shoulder A/ROM flexion to > or = to 140 degrees to improve overhead reaching   Time 8   Period Weeks   Status New   Target Date 12/21/16     PT LONG TERM GOAL #4   Title demonstrate > or = to 4+/5 Rt shoulder strength to improve lifting and use with endurance activities   Time 8   Period Weeks   Status New   Target Date 12/21/16     PT LONG TERM GOAL #5   Title report < or = to 4/10 Rt shoulder pain with writing and use   Time 8   Period Weeks   Status New   Target Date 12/21/16     Additional Long Term Goals   Additional Long Term Goals Yes     PT LONG TERM GOAL #6   Title reutrn to weight lifting with upper body weights without limitation   Time 8   Period Weeks   Status New   Target Date 12/21/16               Plan - 11/14/16 1103    Clinical Impression Statement Pt reported worsening pain since last session. She did not tolerate any AAROM exercises as they contined to increase her pain. Upon feeling pt's soft tissues, she is considerably knotty with significant thickened tissues with active trigger points along the biceps, anterior shoulder and pectorals.  She responded very well to soft tissue work today.     Rehab Potential Good   PT Frequency 2x / week   PT Duration 8 weeks   PT Treatment/Interventions ADLs/Self Care Home Management;Cryotherapy;Functional mobility training;Therapeutic activities;Therapeutic exercise;Neuromuscular re-education;Patient/family education;Passive range of motion;Manual techniques;Dry needling;Taping   PT Next Visit Plan Soft tissue work RTUE.   Consulted and Agree with Plan of Care Patient      Patient will benefit from skilled therapeutic intervention in order to improve the following deficits and impairments:  Postural dysfunction, Decreased strength, Impaired flexibility, Pain, Impaired UE functional use, Decreased endurance, Decreased range of motion, Decreased activity tolerance  Visit Diagnosis: Chronic right shoulder pain  Stiffness of right shoulder, not elsewhere classified  Muscle weakness (generalized)     Problem List Patient Active Problem List   Diagnosis Date Noted  . Ingrown toenail 12/01/2015  . Dysphonia 11/05/2013  . Papillary carcinoma of thyroid ([place]) 11/04/2013  . Paresthesia 07/20/2011  . Hirsutism 11/24/2010  . Menopausal syndrome 11/24/2010  . Screening for malignant neoplasm of cervix 11/24/2010  . Family history of malignant neoplasm of gastrointestinal tract 06/21/2010  . Encounter for general adult medical examination without abnormal findings 05/14/2010  . Irritable bowel syndrome 05/14/2010  . HYPERLIPIDEMIA 11/13/2007  . HYPERTENSION 07/20/2006    Niyla Marone, PTA 11/14/2016, 11:44 AM  [facility] [address] [place], [place], [address] Phone: [phone]   Fax:  [phone]  Name: Cheyenne Bullock MRN: [id number] Date of Birth: October 09, 1960

## 2016-11-16 ENCOUNTER — Encounter: Payer: Self-pay | Admitting: Physical Therapy

## 2016-11-16 ENCOUNTER — Ambulatory Visit: Payer: BC Managed Care – PPO | Admitting: Physical Therapy

## 2016-11-16 DIAGNOSIS — M25511 Pain in right shoulder: Principal | ICD-10-CM

## 2016-11-16 DIAGNOSIS — G8929 Other chronic pain: Secondary | ICD-10-CM

## 2016-11-16 DIAGNOSIS — M25611 Stiffness of right shoulder, not elsewhere classified: Secondary | ICD-10-CM

## 2016-11-16 DIAGNOSIS — M6281 Muscle weakness (generalized): Secondary | ICD-10-CM

## 2016-11-16 NOTE — Therapy (Signed)
[facility] [address], [place], [place], [address] Phone: [phone]   Fax:  [phone]  Physical Therapy Treatment  Patient Details  Name: Cheyenne Bullock MRN: [id number] Date of Birth: [birth year] Referring Provider: Kathyrn Lass, MD  Encounter Date: 11/16/2016      PT End of Session - 11/16/16 1101    Visit Number 6   Date for PT Re-Evaluation 12/21/16   PT Start Time 1100   PT Stop Time 1142   PT Time Calculation (min) 42 min   Activity Tolerance Patient tolerated treatment well   Behavior During Therapy [facility] for tasks assessed/performed      Past Medical History:  Diagnosis Date  . Abdominal pain, generalized   . Arthritis   . Cancer ([place]) 11/12/2013   thyroid removed  . Hyperlipemia   . Hypertension   . Left thyroid nodule     Past Surgical History:  Procedure Laterality Date  . CESAREAN SECTION  01/18/91  . CHOLECYSTECTOMY  11/08  . CYSTECTOMY     from both ovaries  . Uterine Ablation  2008    There were no vitals filed for this visit.      Subjective Assessment - 11/16/16 1102    Subjective It really felt good after session. Still catches in the shoulder and biceps area.    Currently in Pain? No/denies   Multiple Pain Sites No                         [facility] Adult PT Treatment/Exercise - 11/16/16 0001      Shoulder Exercises: Seated   Internal Rotation --   1 min behind the back   Other Seated Exercises pectoral and bicep stretch x 3 min in supine     Shoulder Exercises: Pulleys   Flexion 3 minutes     Shoulder Exercises: Stretch   Other Shoulder Stretches Standing arms out/bicep sttretch  2 x 30 sec     Manual Therapy   Soft tissue mobilization Biceps, ant shoulder, pectorals   Myofascial Release RT bicep, anterior shoulder, pecs                PT Education - 11/16/16 1135    Education provided Yes   Education Details Supine/seated bicep and  pectoral stretching   Person(s) Educated Patient   Methods Explanation;Demonstration;Tactile cues;Verbal cues;Handout   Comprehension Verbalized understanding;Returned demonstration          PT Short Term Goals - 11/07/16 1417      PT SHORT TERM GOAL #1   Title be independent in initial HEP   Time 4   Period Weeks   Status Achieved     PT SHORT TERM GOAL #2   Title report a 30% reduction in Rt shoulder pain with writing and use   Time 4   Status Achieved  90%     PT SHORT TERM GOAL #3   Title demonstrate Rt shoulder A/ROM to > 120 degrees to allow for reaching overhead   Time 4   Period Weeks   Status Achieved           PT Long Term Goals - 10/26/16 1418      PT LONG TERM GOAL #1   Title be indepdendent in advanced HEP   Time 8   Period Weeks   Status New   Target Date 12/21/16     PT LONG TERM GOAL #2   Title reduce  FOTO to < or = to 29% limitation   Time 8   Period Weeks   Status New   Target Date 12/21/16     PT LONG TERM GOAL #3   Title demonstrate Rt shoulder A/ROM flexion to > or = to 140 degrees to improve overhead reaching   Time 8   Period Weeks   Status New   Target Date 12/21/16     PT LONG TERM GOAL #4   Title demonstrate > or = to 4+/5 Rt shoulder strength to improve lifting and use with endurance activities   Time 8   Period Weeks   Status New   Target Date 12/21/16     PT LONG TERM GOAL #5   Title report < or = to 4/10 Rt shoulder pain with writing and use   Time 8   Period Weeks   Status New   Target Date 12/21/16     Additional Long Term Goals   Additional Long Term Goals Yes     PT LONG TERM GOAL #6   Title reutrn to weight lifting with upper body weights without limitation   Time 8   Period Weeks   Status New   Target Date 12/21/16               Plan - 11/16/16 1101    Clinical Impression Statement Pt got significant relief from the soft tissue work last session. When she uses her RTUE that is when her  pain/spasm comes back in her arm and chest.  Discussed trying dry needling to the biceps. Gave pt more stretching for HEP today.    Rehab Potential Good   PT Frequency 2x / week   PT Duration 8 weeks   PT Treatment/Interventions ADLs/Self Care Home Management;Cryotherapy;Functional mobility training;Therapeutic activities;Therapeutic exercise;Neuromuscular re-education;Patient/family education;Passive range of motion;Manual techniques;Dry needling;Taping   PT Next Visit Plan Assess RT bicep, deltoid, and pectorals for DN. Manual work to [place].    Consulted and Agree with Plan of Care --      Patient will benefit from skilled therapeutic intervention in order to improve the following deficits and impairments:  Postural dysfunction, Decreased strength, Impaired flexibility, Pain, Impaired UE functional use, Decreased endurance, Decreased range of motion, Decreased activity tolerance  Visit Diagnosis: Chronic right shoulder pain  Stiffness of right shoulder, not elsewhere classified  Muscle weakness (generalized)     Problem List Patient Active Problem List   Diagnosis Date Noted  . Ingrown toenail 12/01/2015  . Dysphonia 11/05/2013  . Papillary carcinoma of thyroid ([place]) 11/04/2013  . Paresthesia 07/20/2011  . Hirsutism 11/24/2010  . Menopausal syndrome 11/24/2010  . Screening for malignant neoplasm of cervix 11/24/2010  . Family history of malignant neoplasm of gastrointestinal tract 06/21/2010  . Encounter for general adult medical examination without abnormal findings 05/14/2010  . Irritable bowel syndrome 05/14/2010  . HYPERLIPIDEMIA 11/13/2007  . HYPERTENSION 07/20/2006    Brazen Domangue, PTA 11/16/2016, 11:45 AM  [facility] [address] [place], [place], [address] Phone: [phone]   Fax:  [phone]  Name: Arriyah Madej MRN: [id number] Date of Birth: 23-Dec-1960

## 2016-11-16 NOTE — Patient Instructions (Signed)
Daily stretches:  1. Laying on your back: Palms up to the ceiling, arms out to your side, kind of like a "t" position. Relax & breathe x 3 min. Do 2x day.  2. Seated or standing; Arms by yourside, palms forward, gently pull arms back until you feel a gentle stretch in your arms. Hold 30 sec do 3x, 2x day.  3. Clasp hands behind you, keep heart lifted, breathe & relax x 1 min. This will stretch more through the chest muscles. Do 2x, 2 x day.     Watch your positioning. Elbow more often straight then bent.

## 2016-11-22 ENCOUNTER — Ambulatory Visit: Payer: BC Managed Care – PPO | Attending: Family Medicine

## 2016-11-22 DIAGNOSIS — R498 Other voice and resonance disorders: Secondary | ICD-10-CM | POA: Insufficient documentation

## 2016-11-22 DIAGNOSIS — M25511 Pain in right shoulder: Secondary | ICD-10-CM | POA: Diagnosis present

## 2016-11-22 DIAGNOSIS — M6281 Muscle weakness (generalized): Secondary | ICD-10-CM | POA: Diagnosis present

## 2016-11-22 DIAGNOSIS — G8929 Other chronic pain: Secondary | ICD-10-CM | POA: Diagnosis present

## 2016-11-22 DIAGNOSIS — M25611 Stiffness of right shoulder, not elsewhere classified: Secondary | ICD-10-CM | POA: Diagnosis present

## 2016-11-22 NOTE — Patient Instructions (Signed)

## 2016-11-22 NOTE — Therapy (Signed)
[facility] [address] [place], [place], [address] Phone: [phone]   Fax:  [phone]  Physical Therapy Treatment  Patient Details  Name: Cheyenne Bullock MRN: [id number] Date of Birth: [birth year] Referring Provider: Kathyrn Lass, MD   Encounter Date: 11/22/2016  PT End of Session - 11/22/16 1052    Visit Number  7    Date for PT Re-Evaluation  12/21/16    PT Start Time  1019 dry needling   dry needling   PT Stop Time  1102    PT Time Calculation (min)  43 min    Activity Tolerance  Patient tolerated treatment well    Behavior During Therapy  [facility] for tasks assessed/performed       Past Medical History:  Diagnosis Date  . Abdominal pain, generalized   . Arthritis   . Cancer ([place]) 11/12/2013   thyroid removed  . Hyperlipemia   . Hypertension   . Left thyroid nodule     Past Surgical History:  Procedure Laterality Date  . CESAREAN SECTION  01/18/91  . CHOLECYSTECTOMY  11/08  . CYSTECTOMY     from both ovaries  . Uterine Ablation  2008    There were no vitals filed for this visit.  Subjective Assessment - 11/22/16 1021    Subjective  I am ready to try dry needling.  Pt reports Lt shoulder pain started when she externally rotated her Lt shoulder in supine.      Pertinent History  thyroid cancer    Patient Stated Goals  reduce Rt shoulder pain with writing and cooking, return to weight training with arms, reaching overhead    Currently in Pain?  Yes    Pain Score  3     Pain Location  Shoulder    Pain Orientation  Right    Pain Descriptors / Indicators  Aching;Sore;Tightness    Pain Type  Chronic pain    Pain Onset  More than a month ago    Pain Frequency  Constant    Aggravating Factors   when I wake up, certain movements, reaching    Pain Relieving Factors  pain medication, rest         [facility] PT Assessment - 11/22/16 0001      Strength   Right Shoulder Flexion  4-/5                   [facility] Adult PT Treatment/Exercise - 11/22/16 0001      Modalities   Modalities  Moist Heat      Moist Heat Therapy   Number Minutes Moist Heat  12 Minutes    Moist Heat Location  Shoulder      Manual Therapy   Soft tissue mobilization  Biceps, ant shoulder, pectorals    Myofascial Release  RT bicep, anterior shoulder, pecs       Trigger Point Dry Needling - 11/22/16 1048    Consent Given?  Yes    Education Handout Provided  Yes    Muscles Treated Upper Body  Pectoralis major;Pectoralis minor biceps, anterior deltoid   biceps, anterior deltoid   Pectoralis Major Response  Twitch response elicited;Palpable increased muscle length    Pectoralis Minor Response  Twitch response elicited;Palpable increased muscle length           PT Education - 11/22/16 1046    Education provided  Yes    Education Details  DN info    Person(s) Educated  Patient  Methods  Explanation;Handout    Comprehension  Verbalized understanding       PT Short Term Goals - 11/22/16 1022      PT SHORT TERM GOAL #4   Title  demonstrate Rt shoulder flexion > or = to 4/5 to allow for lifting overhead    Baseline  3+/5 with pain and catch reported    Time  4    Period  Weeks    Status  On-going        PT Long Term Goals - 10/26/16 1418      PT LONG TERM GOAL #1   Title  be indepdendent in advanced HEP    Time  8    Period  Weeks    Status  New    Target Date  12/21/16      PT LONG TERM GOAL #2   Title  reduce FOTO to < or = to 29% limitation    Time  8    Period  Weeks    Status  New    Target Date  12/21/16      PT LONG TERM GOAL #3   Title  demonstrate Rt shoulder A/ROM flexion to > or = to 140 degrees to improve overhead reaching    Time  8    Period  Weeks    Status  New    Target Date  12/21/16      PT LONG TERM GOAL #4   Title  demonstrate > or = to 4+/5 Rt shoulder strength to improve lifting and use with endurance activities    Time  8    Period   Weeks    Status  New    Target Date  12/21/16      PT LONG TERM GOAL #5   Title  report < or = to 4/10 Rt shoulder pain with writing and use    Time  8    Period  Weeks    Status  New    Target Date  12/21/16      Additional Long Term Goals   Additional Long Term Goals  Yes      PT LONG TERM GOAL #6   Title  reutrn to weight lifting with upper body weights without limitation    Time  8    Period  Weeks    Status  New    Target Date  12/21/16            Plan - 11/22/16 1024    Clinical Impression Statement  Pt reports 70% improvement in Rt shoulder pain since the start of care.  Pt remains painful and demonstrates reduced strength with testing today.  Pt with Rt biceps, deltoid and pectoralis tension today and demosntrates improved tissue mobiltiy and reduced pain after needling today.  Pt with new onset of Lt UE pain and will discuss with MD.  Pt will continue to beneift from skilled PT for Rt shoulder flexibility, strength and soft tissue.      Rehab Potential  Good    PT Frequency  2x / week    PT Duration  8 weeks    PT Treatment/Interventions  ADLs/Self Care Home Management;Cryotherapy;Functional mobility training;Therapeutic activities;Therapeutic exercise;Neuromuscular re-education;Patient/family education;Passive range of motion;Manual techniques;Dry needling;Taping    PT Next Visit Plan  assess response to dry needling, Rt shoulder strength and flexibility    Consulted and Agree with Plan of Care  Patient       Patient will benefit  from skilled therapeutic intervention in order to improve the following deficits and impairments:  Postural dysfunction, Decreased strength, Impaired flexibility, Pain, Impaired UE functional use, Decreased endurance, Decreased range of motion, Decreased activity tolerance  Visit Diagnosis: Chronic right shoulder pain  Stiffness of right shoulder, not elsewhere classified  Muscle weakness (generalized)     Problem List Patient  Active Problem List   Diagnosis Date Noted  . Ingrown toenail 12/01/2015  . Dysphonia 11/05/2013  . Papillary carcinoma of thyroid ([place]) 11/04/2013  . Paresthesia 07/20/2011  . Hirsutism 11/24/2010  . Menopausal syndrome 11/24/2010  . Screening for malignant neoplasm of cervix 11/24/2010  . Family history of malignant neoplasm of gastrointestinal tract 06/21/2010  . Encounter for general adult medical examination without abnormal findings 05/14/2010  . Irritable bowel syndrome 05/14/2010  . HYPERLIPIDEMIA 11/13/2007  . HYPERTENSION 07/20/2006     Sigurd Sos, PT 11/22/16 10:54 AM  [facility] [address] [place], [place], [address] Phone: [phone]   Fax:  [phone]  Name: Cheyenne Bullock MRN: [id number] Date of Birth: 1960-07-05

## 2016-11-24 ENCOUNTER — Ambulatory Visit: Payer: BC Managed Care – PPO

## 2016-11-24 DIAGNOSIS — G8929 Other chronic pain: Secondary | ICD-10-CM

## 2016-11-24 DIAGNOSIS — M25511 Pain in right shoulder: Principal | ICD-10-CM

## 2016-11-24 DIAGNOSIS — M6281 Muscle weakness (generalized): Secondary | ICD-10-CM

## 2016-11-24 DIAGNOSIS — M25611 Stiffness of right shoulder, not elsewhere classified: Secondary | ICD-10-CM

## 2016-11-24 NOTE — Therapy (Signed)
[facility] [address] [place], [place], [address] Phone: [phone]   Fax:  [phone]  Physical Therapy Treatment  Patient Details  Name: Cheyenne Bullock MRN: [id number] Date of Birth: 05-Jun-1960 Referring Provider: Kathyrn Lass, MD   Encounter Date: 11/24/2016  PT End of Session - 11/24/16 1057    Visit Number  8    Date for PT Re-Evaluation  12/21/16    PT Start Time  1017    PT Stop Time  1056    PT Time Calculation (min)  39 min    Activity Tolerance  Patient tolerated treatment well    Behavior During Therapy  [facility] for tasks assessed/performed       Past Medical History:  Diagnosis Date  . Abdominal pain, generalized   . Arthritis   . Cancer ([place]) 11/12/2013   thyroid removed  . Hyperlipemia   . Hypertension   . Left thyroid nodule     Past Surgical History:  Procedure Laterality Date  . CESAREAN SECTION  01/18/91  . CHOLECYSTECTOMY  11/08  . CYSTECTOMY     from both ovaries  . Uterine Ablation  2008    There were no vitals filed for this visit.  Subjective Assessment - 11/24/16 1019    Subjective  The massage to the Rt arm really helps me.  Lt shoulder hurts more than Rt today.  I felt ok after dry needling    Patient Stated Goals  reduce Rt shoulder pain with writing and cooking, return to weight training with arms, reaching overhead    Currently in Pain?  Yes    Pain Score  3     Pain Location  Shoulder    Pain Orientation  Right                      [facility] Adult PT Treatment/Exercise - 11/24/16 0001      Shoulder Exercises: Seated   Other Seated Exercises  pectoral and bicep stretch x 3 min in supine      Shoulder Exercises: Standing   Other Standing Exercises  [place] 4 ways : 2x10 each tactile cues for technique   tactile cues for technique     Shoulder Exercises: Pulleys   Flexion  3 minutes      Shoulder Exercises: Stretch   Corner Stretch  3 reps;20  seconds doorway pec stretch with Rt only   doorway pec stretch with Rt only     Manual Therapy   Manual Therapy  Taping    Soft tissue mobilization  Biceps, ant shoulder, pectorals    Myofascial Release  RT bicep, anterior shoulder, pecs    Kinesiotex  Inhibit Muscle      Kinesiotix   Inhibit Muscle   kinesiotape applied to Rt biceps, pectoralis and deltoid using Y strip               PT Short Term Goals - 11/22/16 1022      PT SHORT TERM GOAL #4   Title  demonstrate Rt shoulder flexion > or = to 4/5 to allow for lifting overhead    Baseline  3+/5 with pain and catch reported    Time  4    Period  Weeks    Status  On-going        PT Long Term Goals - 10/26/16 1418      PT LONG TERM GOAL #1   Title  be indepdendent in advanced  HEP    Time  8    Period  Weeks    Status  New    Target Date  12/21/16      PT LONG TERM GOAL #2   Title  reduce FOTO to < or = to 29% limitation    Time  8    Period  Weeks    Status  New    Target Date  12/21/16      PT LONG TERM GOAL #3   Title  demonstrate Rt shoulder A/ROM flexion to > or = to 140 degrees to improve overhead reaching    Time  8    Period  Weeks    Status  New    Target Date  12/21/16      PT LONG TERM GOAL #4   Title  demonstrate > or = to 4+/5 Rt shoulder strength to improve lifting and use with endurance activities    Time  8    Period  Weeks    Status  New    Target Date  12/21/16      PT LONG TERM GOAL #5   Title  report < or = to 4/10 Rt shoulder pain with writing and use    Time  8    Period  Weeks    Status  New    Target Date  12/21/16      Additional Long Term Goals   Additional Long Term Goals  Yes      PT LONG TERM GOAL #6   Title  reutrn to weight lifting with upper body weights without limitation    Time  8    Period  Weeks    Status  New    Target Date  12/21/16            Plan - 11/24/16 1025    Clinical Impression Statement  Pt reports that only massage helps the Rt  shoulder.  Pt resistant to exercise because she has "knots in the Rt arm" and exercise aggravates it.  PT spent part of session educating pt on the importance of manual and movement to improve strength and flexibility around the joint.  Pt with minor trigger points in Rt biceps and pectoralis and this is significantly improved.  Pt will benefit from continued PT for dry needling, manual and gentle strength and flexibility progression.      Rehab Potential  Good    PT Frequency  2x / week    PT Duration  8 weeks    PT Treatment/Interventions  ADLs/Self Care Home Management;Cryotherapy;Functional mobility training;Therapeutic activities;Therapeutic exercise;Neuromuscular re-education;Patient/family education;Passive range of motion;Manual techniques;Dry needling;Taping    PT Next Visit Plan  Dry needling to Rt arm next session, Rt shoulder strength and flexibility    Recommended Other Services  certification-2nd attempt sent    Consulted and Agree with Plan of Care  Patient       Patient will benefit from skilled therapeutic intervention in order to improve the following deficits and impairments:  Postural dysfunction, Decreased strength, Impaired flexibility, Pain, Impaired UE functional use, Decreased endurance, Decreased range of motion, Decreased activity tolerance  Visit Diagnosis: Chronic right shoulder pain  Stiffness of right shoulder, not elsewhere classified  Muscle weakness (generalized)     Problem List Patient Active Problem List   Diagnosis Date Noted  . Ingrown toenail 12/01/2015  . Dysphonia 11/05/2013  . Papillary carcinoma of thyroid ([place]) 11/04/2013  . Paresthesia 07/20/2011  . Hirsutism 11/24/2010  .  Menopausal syndrome 11/24/2010  . Screening for malignant neoplasm of cervix 11/24/2010  . Family history of malignant neoplasm of gastrointestinal tract 06/21/2010  . Encounter for general adult medical examination without abnormal findings 05/14/2010  . Irritable  bowel syndrome 05/14/2010  . HYPERLIPIDEMIA 11/13/2007  . HYPERTENSION 07/20/2006    Sigurd Sos, PT 11/24/16 11:00 AM  [facility] [address] [place], [place], [address] Phone: [phone]   Fax:  [phone]  Name: Cheyenne Bullock MRN: [id number] Date of Birth: Apr 22, 1960

## 2016-11-30 ENCOUNTER — Ambulatory Visit: Payer: BC Managed Care – PPO

## 2016-11-30 DIAGNOSIS — M25511 Pain in right shoulder: Secondary | ICD-10-CM | POA: Diagnosis not present

## 2016-11-30 DIAGNOSIS — R498 Other voice and resonance disorders: Secondary | ICD-10-CM

## 2016-11-30 DIAGNOSIS — M25611 Stiffness of right shoulder, not elsewhere classified: Secondary | ICD-10-CM

## 2016-11-30 DIAGNOSIS — G8929 Other chronic pain: Secondary | ICD-10-CM

## 2016-11-30 DIAGNOSIS — M6281 Muscle weakness (generalized): Secondary | ICD-10-CM

## 2016-11-30 NOTE — Therapy (Signed)
[facility] [address] [place], [place], [address] Phone: [phone]   Fax:  [phone]  Physical Therapy Treatment  Patient Details  Name: Cheyenne Bullock MRN: [id number] Date of Birth: [birth year] Referring Provider: Kathyrn Lass, MD   Encounter Date: 11/30/2016  PT End of Session - 11/30/16 1138    Visit Number  9    Date for PT Re-Evaluation  12/21/16    PT Start Time  1101    PT Stop Time  1152    PT Time Calculation (min)  51 min    Activity Tolerance  Patient tolerated treatment well    Behavior During Therapy  [facility] for tasks assessed/performed       Past Medical History:  Diagnosis Date  . Abdominal pain, generalized   . Arthritis   . Cancer ([place]) 11/12/2013   thyroid removed  . Hyperlipemia   . Hypertension   . Left thyroid nodule     Past Surgical History:  Procedure Laterality Date  . CESAREAN SECTION  01/18/91  . CHOLECYSTECTOMY  11/08  . CYSTECTOMY     from both ovaries  . Uterine Ablation  2008    There were no vitals filed for this visit.  Subjective Assessment - 11/30/16 1105    Subjective  I felt good for 3 days after last treatment with the taping and heat.  Dry needling didn't seem to help.      Currently in Pain?  Yes    Pain Score  4     Pain Location  Shoulder    Pain Orientation  Right    Pain Descriptors / Indicators  Aching;Sore    Pain Type  Chronic pain    Pain Onset  More than a month ago    Pain Frequency  Constant    Aggravating Factors   certain movements, reaching, early in the morning    Pain Relieving Factors  pain medication, rest                      [facility] Adult PT Treatment/Exercise - 11/30/16 0001      Shoulder Exercises: Standing   Other Standing Exercises  --      Shoulder Exercises: Pulleys   Flexion  3 minutes      Shoulder Exercises: ROM/Strengthening   UBE (Upper Arm Bike)  Level 0 for 4 minutes in reverse increased Rt UE pain  after      Shoulder Exercises: Stretch   Corner Stretch  3 reps;20 seconds doorway pec stretch with Rt only      Modalities   Modalities  Moist Heat      Moist Heat Therapy   Number Minutes Moist Heat  12 Minutes    Moist Heat Location  Shoulder      Manual Therapy   Manual Therapy  Taping    Soft tissue mobilization  Biceps, ant shoulder, pectoralis    Myofascial Release  Rt bicep, anterior shoulder, pecs    Kinesiotex  Inhibit Muscle      Kinesiotix   Inhibit Muscle   kinesiotape applied to Rt biceps, pectoralis and deltoid using Y strip               PT Short Term Goals - 11/22/16 1022      PT SHORT TERM GOAL #4   Title  demonstrate Rt shoulder flexion > or = to 4/5 to allow for lifting overhead    Baseline  3+/5  with pain and catch reported    Time  4    Period  Weeks    Status  On-going        PT Long Term Goals - 10/26/16 1418      PT LONG TERM GOAL #1   Title  be indepdendent in advanced HEP    Time  8    Period  Weeks    Status  New    Target Date  12/21/16      PT LONG TERM GOAL #2   Title  reduce FOTO to < or = to 29% limitation    Time  8    Period  Weeks    Status  New    Target Date  12/21/16      PT LONG TERM GOAL #3   Title  demonstrate Rt shoulder A/ROM flexion to > or = to 140 degrees to improve overhead reaching    Time  8    Period  Weeks    Status  New    Target Date  12/21/16      PT LONG TERM GOAL #4   Title  demonstrate > or = to 4+/5 Rt shoulder strength to improve lifting and use with endurance activities    Time  8    Period  Weeks    Status  New    Target Date  12/21/16      PT LONG TERM GOAL #5   Title  report < or = to 4/10 Rt shoulder pain with writing and use    Time  8    Period  Weeks    Status  New    Target Date  12/21/16      Additional Long Term Goals   Additional Long Term Goals  Yes      PT LONG TERM GOAL #6   Title  reutrn to weight lifting with upper body weights without limitation    Time  8     Period  Weeks    Status  New    Target Date  12/21/16            Plan - 11/30/16 1111    Clinical Impression Statement  Pt felt improvement after manual and kinesiotaping last session.  Pt reports that exercise with theraband increases her pain.  Pt is resistant to perform activity with Rt UE due to aggravation of symptoms with all.  Pt with minor trigger points in Rt biceps and pectoralis and this is significantly improved.  Pt will benefit from skilled PT for gentle advancement of Rt UE mobility/strength/flexibility, manual and taping as needed.      Rehab Potential  Good    PT Frequency  2x / week    PT Duration  8 weeks    PT Treatment/Interventions  ADLs/Self Care Home Management;Cryotherapy;Functional mobility training;Therapeutic activities;Therapeutic exercise;Neuromuscular re-education;Patient/family education;Passive range of motion;Manual techniques;Dry needling;Taping    PT Next Visit Plan  Assess response to taping, manual and advance exercise as able    Consulted and Agree with Plan of Care  Patient       Patient will benefit from skilled therapeutic intervention in order to improve the following deficits and impairments:  Postural dysfunction, Decreased strength, Impaired flexibility, Pain, Impaired UE functional use, Decreased endurance, Decreased range of motion, Decreased activity tolerance  Visit Diagnosis: Chronic right shoulder pain  Stiffness of right shoulder, not elsewhere classified  Muscle weakness (generalized)  Other voice and resonance disorders  Problem List Patient Active Problem List   Diagnosis Date Noted  . Ingrown toenail 12/01/2015  . Dysphonia 11/05/2013  . Papillary carcinoma of thyroid ([place]) 11/04/2013  . Paresthesia 07/20/2011  . Hirsutism 11/24/2010  . Menopausal syndrome 11/24/2010  . Screening for malignant neoplasm of cervix 11/24/2010  . Family history of malignant neoplasm of gastrointestinal tract 06/21/2010  .  Encounter for general adult medical examination without abnormal findings 05/14/2010  . Irritable bowel syndrome 05/14/2010  . HYPERLIPIDEMIA 11/13/2007  . HYPERTENSION 07/20/2006     Sigurd Sos, PT 11/30/16 11:41 AM  [facility] [address], [place], [place], [address] Phone: [phone]   Fax:  [phone]  Name: Cheyenne Bullock MRN: [id number] Date of Birth: 1960-07-07

## 2016-12-06 ENCOUNTER — Encounter: Payer: Self-pay | Admitting: Physical Therapy

## 2016-12-06 ENCOUNTER — Ambulatory Visit: Payer: BC Managed Care – PPO | Admitting: Physical Therapy

## 2016-12-06 DIAGNOSIS — M25611 Stiffness of right shoulder, not elsewhere classified: Secondary | ICD-10-CM

## 2016-12-06 DIAGNOSIS — M6281 Muscle weakness (generalized): Secondary | ICD-10-CM

## 2016-12-06 DIAGNOSIS — M25511 Pain in right shoulder: Secondary | ICD-10-CM | POA: Diagnosis not present

## 2016-12-06 DIAGNOSIS — G8929 Other chronic pain: Secondary | ICD-10-CM

## 2016-12-06 DIAGNOSIS — R498 Other voice and resonance disorders: Secondary | ICD-10-CM

## 2016-12-06 NOTE — Therapy (Signed)
[facility] [address], [place], [place], [address] Phone: [phone]   Fax:  [phone]  Physical Therapy Treatment  Patient Details  Name: Cheyenne Bullock MRN: [id number] Date of Birth: March 16, 1960 Referring Provider: Kathyrn Lass, MD   Encounter Date: 12/06/2016  PT End of Session - 12/06/16 1728    Visit Number  10    Date for PT Re-Evaluation  12/21/16    PT Start Time  1105    PT Stop Time  1150    PT Time Calculation (min)  45 min    Activity Tolerance  Patient tolerated treatment well    Behavior During Therapy  [facility] for tasks assessed/performed       Past Medical History:  Diagnosis Date  . Abdominal pain, generalized   . Arthritis   . Cancer ([place]) 11/12/2013   thyroid removed  . Hyperlipemia   . Hypertension   . Left thyroid nodule     Past Surgical History:  Procedure Laterality Date  . CESAREAN SECTION  01/18/91  . CHOLECYSTECTOMY  11/08  . CYSTECTOMY     from both ovaries  . Uterine Ablation  2008    There were no vitals filed for this visit.  Subjective Assessment - 12/06/16 1108    Subjective  I felt good yesterday until I reached up to hug someone taller than me and now it hurts down into the arm again    Patient Stated Goals  reduce Rt shoulder pain with writing and cooking, return to weight training with arms, reaching overhead    Currently in Pain?  Yes    Pain Score  5     Pain Location  Shoulder    Pain Orientation  Right    Pain Descriptors / Indicators  Aching;Sore    Pain Type  Chronic pain    Pain Onset  More than a month ago    Pain Frequency  Constant    Aggravating Factors   certain moves, reaching    Pain Relieving Factors  pain medicine, rest, massage, tape and heat    Multiple Pain Sites  No                      [facility] Adult PT Treatment/Exercise - 12/06/16 0001      Shoulder Exercises: Pulleys   Flexion  3 minutes      Shoulder Exercises:  Stretch   Corner Stretch  3 reps;20 seconds doorway pec stretch with Rt only      Manual Therapy   Soft tissue mobilization  Biceps, ant shoulder, pectoralis    Myofascial Release  Rt bicep, anterior shoulder, pecs    Kinesiotex  Inhibit Muscle      Kinesiotix   Inhibit Muscle   Rt bicep, deltoid and pectoralis               PT Short Term Goals - 12/06/16 1732      PT SHORT TERM GOAL #4   Title  demonstrate Rt shoulder flexion > or = to 4/5 to allow for lifting overhead    Time  4    Period  Weeks    Status  On-going        PT Long Term Goals - 10/26/16 1418      PT LONG TERM GOAL #1   Title  be indepdendent in advanced HEP    Time  8    Period  Weeks    Status  New    Target Date  12/21/16      PT LONG TERM GOAL #2   Title  reduce FOTO to < or = to 29% limitation    Time  8    Period  Weeks    Status  New    Target Date  12/21/16      PT LONG TERM GOAL #3   Title  demonstrate Rt shoulder A/ROM flexion to > or = to 140 degrees to improve overhead reaching    Time  8    Period  Weeks    Status  New    Target Date  12/21/16      PT LONG TERM GOAL #4   Title  demonstrate > or = to 4+/5 Rt shoulder strength to improve lifting and use with endurance activities    Time  8    Period  Weeks    Status  New    Target Date  12/21/16      PT LONG TERM GOAL #5   Title  report < or = to 4/10 Rt shoulder pain with writing and use    Time  8    Period  Weeks    Status  New    Target Date  12/21/16      Additional Long Term Goals   Additional Long Term Goals  Yes      PT LONG TERM GOAL #6   Title  reutrn to weight lifting with upper body weights without limitation    Time  8    Period  Weeks    Status  New    Target Date  12/21/16            Plan - 12/06/16 1728    Clinical Impression Statement  Pt felt improvement after previous session.  Taping was done again due to patient reporting it felt better after last session, but after performing manual  work, tape was not sticking as well.  Pt continues to have muscle spasms in triceps, biceps, deltiod and pectoralis muscles on right side.  Skilled PT will benefit patient to work on reduced muscle spasms and to strengthen shoulder in order to return to functional activities without pain.    PT Treatment/Interventions  ADLs/Self Care Home Management;Cryotherapy;Functional mobility training;Therapeutic activities;Therapeutic exercise;Neuromuscular re-education;Patient/family education;Passive range of motion;Manual techniques;Dry needling;Taping    PT Next Visit Plan  Assess response to taping, manual and advance exercise as able    Consulted and Agree with Plan of Care  Patient       Patient will benefit from skilled therapeutic intervention in order to improve the following deficits and impairments:  Postural dysfunction, Decreased strength, Impaired flexibility, Pain, Impaired UE functional use, Decreased endurance, Decreased range of motion, Decreased activity tolerance  Visit Diagnosis: Chronic right shoulder pain  Stiffness of right shoulder, not elsewhere classified  Muscle weakness (generalized)  Other voice and resonance disorders     Problem List Patient Active Problem List   Diagnosis Date Noted  . Ingrown toenail 12/01/2015  . Dysphonia 11/05/2013  . Papillary carcinoma of thyroid ([place]) 11/04/2013  . Paresthesia 07/20/2011  . Hirsutism 11/24/2010  . Menopausal syndrome 11/24/2010  . Screening for malignant neoplasm of cervix 11/24/2010  . Family history of malignant neoplasm of gastrointestinal tract 06/21/2010  . Encounter for general adult medical examination without abnormal findings 05/14/2010  . Irritable bowel syndrome 05/14/2010  . HYPERLIPIDEMIA 11/13/2007  . HYPERTENSION 07/20/2006    Zannie Cove, PT 12/06/2016, 5:34  PM  [facility] [address] [place], [place], [address] Phone:  [phone]   Fax:  [phone]  Name: Cheyenne Bullock MRN: [id number] Date of Birth: Aug 06, 1960

## 2016-12-12 ENCOUNTER — Ambulatory Visit: Payer: BC Managed Care – PPO | Admitting: Physical Therapy

## 2016-12-12 ENCOUNTER — Encounter: Payer: Self-pay | Admitting: Physical Therapy

## 2016-12-12 DIAGNOSIS — M6281 Muscle weakness (generalized): Secondary | ICD-10-CM

## 2016-12-12 DIAGNOSIS — M25511 Pain in right shoulder: Principal | ICD-10-CM

## 2016-12-12 DIAGNOSIS — G8929 Other chronic pain: Secondary | ICD-10-CM

## 2016-12-12 DIAGNOSIS — M25611 Stiffness of right shoulder, not elsewhere classified: Secondary | ICD-10-CM

## 2016-12-12 DIAGNOSIS — R498 Other voice and resonance disorders: Secondary | ICD-10-CM

## 2016-12-12 NOTE — Therapy (Signed)
[facility] [address] [place], [place], [address] Phone: [phone]   Fax:  [phone]  Physical Therapy Treatment  Patient Details  Name: Cheyenne Bullock MRN: [id number] Date of Birth: 25-May-1960 Referring Provider: Kathyrn Lass, MD   Encounter Date: 12/12/2016  PT End of Session - 12/12/16 1411    Visit Number  11    Date for PT Re-Evaluation  12/21/16    PT Start Time  4268    PT Stop Time  1445    PT Time Calculation (min)  40 min    Activity Tolerance  Patient tolerated treatment well    Behavior During Therapy  [facility] for tasks assessed/performed       Past Medical History:  Diagnosis Date  . Abdominal pain, generalized   . Arthritis   . Cancer ([place]) 11/12/2013   thyroid removed  . Hyperlipemia   . Hypertension   . Left thyroid nodule     Past Surgical History:  Procedure Laterality Date  . CESAREAN SECTION  01/18/91  . CHOLECYSTECTOMY  11/08  . CYSTECTOMY     from both ovaries  . Uterine Ablation  2008    There were no vitals filed for this visit.  Subjective Assessment - 12/12/16 1413    Subjective  Had some very good days then this AM she was doing her yoga stretches and hurt the Rt shoulder     Currently in Pain?  Yes    Pain Score  6     Pain Location  -- Pectoral to upper arm    Pain Orientation  Right    Pain Descriptors / Indicators  Tender    Aggravating Factors   Reaching    Pain Relieving Factors  massage, tape    Multiple Pain Sites  No                      [facility] Adult PT Treatment/Exercise - 12/12/16 0001      Shoulder Exercises: Seated   Other Seated Exercises  1# bicep curla 2x10      Shoulder Exercises: Pulleys   Flexion  3 minutes      Shoulder Exercises: Stretch   Corner Stretch  3 reps;20 seconds      Manual Therapy   Soft tissue mobilization  Biceps, ant shoulder, pectoralis    Myofascial Release  Rt bicep, anterior shoulder, pecs    Kinesiotex  Inhibit Muscle      Kinesiotix   Inhibit Muscle   Rt bicep, deltoid and pectoralis               PT Short Term Goals - 12/06/16 1732      PT SHORT TERM GOAL #4   Title  demonstrate Rt shoulder flexion > or = to 4/5 to allow for lifting overhead    Time  4    Period  Weeks    Status  On-going        PT Long Term Goals - 10/26/16 1418      PT LONG TERM GOAL #1   Title  be indepdendent in advanced HEP    Time  8    Period  Weeks    Status  New    Target Date  12/21/16      PT LONG TERM GOAL #2   Title  reduce FOTO to < or = to 29% limitation    Time  8    Period  Weeks  Status  New    Target Date  12/21/16      PT LONG TERM GOAL #3   Title  demonstrate Rt shoulder A/ROM flexion to > or = to 140 degrees to improve overhead reaching    Time  8    Period  Weeks    Status  New    Target Date  12/21/16      PT LONG TERM GOAL #4   Title  demonstrate > or = to 4+/5 Rt shoulder strength to improve lifting and use with endurance activities    Time  8    Period  Weeks    Status  New    Target Date  12/21/16      PT LONG TERM GOAL #5   Title  report < or = to 4/10 Rt shoulder pain with writing and use    Time  8    Period  Weeks    Status  New    Target Date  12/21/16      Additional Long Term Goals   Additional Long Term Goals  Yes      PT LONG TERM GOAL #6   Title  reutrn to weight lifting with upper body weights without limitation    Time  8    Period  Weeks    Status  New    Target Date  12/21/16            Plan - 12/12/16 1411    Clinical Impression Statement  pt reported today she had multiple days of feeling good, until this AM when she was doing her yoga stretches with her strap. Continue to use manual techniques to decrease the spsms in her her Rt biceps and pecs primarily. She continues to report  reduced pain with use of KTape.     Rehab Potential  Good    PT Frequency  2x / week    PT Duration  8 weeks    PT  Treatment/Interventions  ADLs/Self Care Home Management;Cryotherapy;Functional mobility training;Therapeutic activities;Therapeutic exercise;Neuromuscular re-education;Patient/family education;Passive range of motion;Manual techniques;Dry needling;Taping    PT Next Visit Plan  Assess response to taping, manual and advance exercise as able    Consulted and Agree with Plan of Care  Patient       Patient will benefit from skilled therapeutic intervention in order to improve the following deficits and impairments:  Postural dysfunction, Decreased strength, Impaired flexibility, Pain, Impaired UE functional use, Decreased endurance, Decreased range of motion, Decreased activity tolerance  Visit Diagnosis: Chronic right shoulder pain  Stiffness of right shoulder, not elsewhere classified  Muscle weakness (generalized)  Other voice and resonance disorders     Problem List Patient Active Problem List   Diagnosis Date Noted  . Ingrown toenail 12/01/2015  . Dysphonia 11/05/2013  . Papillary carcinoma of thyroid ([place]) 11/04/2013  . Paresthesia 07/20/2011  . Hirsutism 11/24/2010  . Menopausal syndrome 11/24/2010  . Screening for malignant neoplasm of cervix 11/24/2010  . Family history of malignant neoplasm of gastrointestinal tract 06/21/2010  . Encounter for general adult medical examination without abnormal findings 05/14/2010  . Irritable bowel syndrome 05/14/2010  . HYPERLIPIDEMIA 11/13/2007  . HYPERTENSION 07/20/2006    Kip Kautzman, PTA 12/12/2016, 2:49 PM  [facility] [address] [place], [place], [address] Phone: [phone]   Fax:  [phone]  Name: Cheyenne Bullock MRN: [id number] Date of Birth: 1960/12/14

## 2016-12-14 ENCOUNTER — Ambulatory Visit: Payer: BC Managed Care – PPO | Admitting: Physical Therapy

## 2016-12-14 DIAGNOSIS — G8929 Other chronic pain: Secondary | ICD-10-CM

## 2016-12-14 DIAGNOSIS — M25511 Pain in right shoulder: Principal | ICD-10-CM

## 2016-12-14 DIAGNOSIS — M25611 Stiffness of right shoulder, not elsewhere classified: Secondary | ICD-10-CM

## 2016-12-14 DIAGNOSIS — M6281 Muscle weakness (generalized): Secondary | ICD-10-CM

## 2016-12-14 NOTE — Therapy (Signed)
[facility] [address], [place], [place], [address] Phone: [phone]   Fax:  [phone]  Physical Therapy Treatment  Patient Details  Name: Cheyenne Bullock MRN: [id number] Date of Birth: 10/02/1960 Referring Provider: Kathyrn Lass, MD   Encounter Date: 12/14/2016  PT End of Session - 12/14/16 1008    Visit Number  12    Date for PT Re-Evaluation  12/21/16    PT Start Time  8599 pt arrived late    PT Stop Time  1020    PT Time Calculation (min)  43 min       Past Medical History:  Diagnosis Date  . Abdominal pain, generalized   . Arthritis   . Cancer ([place]) 11/12/2013   thyroid removed  . Hyperlipemia   . Hypertension   . Left thyroid nodule     Past Surgical History:  Procedure Laterality Date  . CESAREAN SECTION  01/18/91  . CHOLECYSTECTOMY  11/08  . CYSTECTOMY     from both ovaries  . Uterine Ablation  2008    There were no vitals filed for this visit.  Subjective Assessment - 12/14/16 0940    Subjective  Pt reports 65% improvement since initating therapy.  The skin rolling that was done last session really helped.    Patient Stated Goals  reduce Rt shoulder pain with writing and cooking, return to weight training with arms, reaching overhead    Currently in Pain?  Yes    Pain Score  5     Pain Location  Shoulder    Pain Orientation  Right    Pain Descriptors / Indicators  Aching    Aggravating Factors   reaching     Pain Relieving Factors  massage, tape          [facility] PT Assessment - 12/14/16 0001      Assessment   Medical Diagnosis  Rt shoulder pain    Referring Provider  Kathyrn Lass, MD    Onset Date/Surgical Date  10/27/15      AROM   Right Shoulder Flexion  145 Degrees standing    Right Shoulder ABduction  145 Degrees standing    Right Shoulder Internal Rotation  -- tip of thumb to bra strap    Right Shoulder External Rotation  90 Degrees standing, abd to 90 deg (with pain)         [facility] Adult PT Treatment/Exercise - 12/14/16 0001      Shoulder Exercises: Supine   Other Supine Exercises  overhead pull with yellow band x 10 reps       Shoulder Exercises: Standing   External Rotation  Strengthening;Both;10 reps;Theraband 2 sets    Extension  Strengthening;Right;10 reps;Theraband 2 sets    Theraband Level (Shoulder Extension)  Level 1 (Yellow)    Row  Both;10 reps;Theraband 2 sets    Theraband Level (Shoulder Row)  Level 1 (Yellow)    Other Standing Exercises  Rt shoulder flexion with 1# wt - waist to shoulder height shelf with eccentric control to counter.       Shoulder Exercises: Pulleys   Flexion  3 minutes    ABduction  2 minutes VC for technique and speed.       Shoulder Exercises: Stretch   Other Shoulder Stretches  Rt shoulder ext stretch x 30 sec x 2 reps;  3 position doorway stretch x 20 sec x 1 rep each.       Manual Therapy  Manual therapy comments  Ktape applied with 15% stretch to Rt ant/ post deltoid; I strip of tape applied to prox to mid bicep with 50% stretch - all tape applied to decompress tissue, decrease pain and sensitivity; increase proprioception and ROM.     Soft tissue mobilization  pin and passive stretch to Rt pec     Myofascial Release  Rt pec release              PT Education - 12/14/16 1128    Education provided  Yes    Education Details  HEP - added overhead pull with yellow band and bilat shoulder ER with yellow band (supine or standing)     Person(s) Educated  Patient    Methods  Explanation;Demonstration;Verbal cues    Comprehension  Returned demonstration;Verbalized understanding       PT Short Term Goals - 12/14/16 1130      PT SHORT TERM GOAL #1   Title  be independent in initial HEP    Time  4    Period  Weeks    Status  Achieved      PT SHORT TERM GOAL #2   Title  report a 30% reduction in Rt shoulder pain with writing and use    Time  4    Period  Weeks    Status  Achieved      PT SHORT TERM  GOAL #3   Title  demonstrate Rt shoulder A/ROM to > 120 degrees to allow for reaching overhead    Time  4    Period  Weeks    Status  Achieved      PT SHORT TERM GOAL #4   Title  demonstrate Rt shoulder flexion > or = to 4/5 to allow for lifting overhead    Period  Weeks    Status  On-going        PT Long Term Goals - 12/14/16 0959      PT LONG TERM GOAL #1   Title  be indepdendent in advanced HEP    Time  8    Period  Weeks    Status  On-going      PT LONG TERM GOAL #2   Title  reduce FOTO to < or = to 29% limitation    Time  8    Period  Weeks    Status  On-going      PT LONG TERM GOAL #3   Title  demonstrate Rt shoulder A/ROM flexion to > or = to 140 degrees to improve overhead reaching    Time  8    Period  Weeks    Status  Achieved      PT LONG TERM GOAL #4   Title  demonstrate > or = to 4+/5 Rt shoulder strength to improve lifting and use with endurance activities    Time  8    Period  Weeks    Status  On-going      PT LONG TERM GOAL #5   Title  report < or = to 4/10 Rt shoulder pain with writing and use    Time  8    Period  Weeks    Status  Partially Met no pain with writing      PT LONG TERM GOAL #6   Title  return to weight lifting with upper body weights without limitation    Time  8    Period  Weeks    Status  On-going  Plan - 12/14/16 1125    Clinical Impression Statement  Pt tolerated all  resistive exercises with minimal increase in pain, only report of occasional clicking in ant Rt shoulder.  Pt required minor cues of form and posture with exercise. Pt's Rt shoulder ROM has improved; she has met LTG #3.  she has also partially met LTG #5.  Pt making gradual progress towards remaining goals.     Rehab Potential  Good    PT Frequency  2x / week    PT Duration  8 weeks    PT Treatment/Interventions  ADLs/Self Care Home Management;Cryotherapy;Functional mobility training;Therapeutic activities;Therapeutic exercise;Neuromuscular  re-education;Patient/family education;Passive range of motion;Manual techniques;Dry needling;Taping    PT Next Visit Plan  Assess response to inclusion of resistance exercises. Continue prog HEP as tolerated.  End of POC 12/5 -reassess goals and need for additional visits vs return to MD or d/c.      Consulted and Agree with Plan of Care  Patient       Patient will benefit from skilled therapeutic intervention in order to improve the following deficits and impairments:  Postural dysfunction, Decreased strength, Impaired flexibility, Pain, Impaired UE functional use, Decreased endurance, Decreased range of motion, Decreased activity tolerance  Visit Diagnosis: Chronic right shoulder pain  Stiffness of right shoulder, not elsewhere classified  Muscle weakness (generalized)     Problem List Patient Active Problem List   Diagnosis Date Noted  . Ingrown toenail 12/01/2015  . Dysphonia 11/05/2013  . Papillary carcinoma of thyroid ([place]) 11/04/2013  . Paresthesia 07/20/2011  . Hirsutism 11/24/2010  . Menopausal syndrome 11/24/2010  . Screening for malignant neoplasm of cervix 11/24/2010  . Family history of malignant neoplasm of gastrointestinal tract 06/21/2010  . Encounter for general adult medical examination without abnormal findings 05/14/2010  . Irritable bowel syndrome 05/14/2010  . HYPERLIPIDEMIA 11/13/2007  . HYPERTENSION 07/20/2006   Kerin Perna, PTA 12/14/16 11:31 AM   [facility] [address] [place], [place], [address] Phone: [phone]   Fax:  [phone]  Name: Cheyenne Bullock MRN: [id number] Date of Birth: 13-Feb-1960

## 2016-12-19 ENCOUNTER — Ambulatory Visit: Payer: BC Managed Care – PPO | Attending: Family Medicine

## 2016-12-19 DIAGNOSIS — R498 Other voice and resonance disorders: Secondary | ICD-10-CM | POA: Insufficient documentation

## 2016-12-19 DIAGNOSIS — M25611 Stiffness of right shoulder, not elsewhere classified: Secondary | ICD-10-CM

## 2016-12-19 DIAGNOSIS — M25511 Pain in right shoulder: Secondary | ICD-10-CM | POA: Insufficient documentation

## 2016-12-19 DIAGNOSIS — G8929 Other chronic pain: Secondary | ICD-10-CM | POA: Diagnosis present

## 2016-12-19 DIAGNOSIS — M6281 Muscle weakness (generalized): Secondary | ICD-10-CM | POA: Diagnosis present

## 2016-12-19 NOTE — Therapy (Signed)
[facility] [address] [place], [place], [address] Phone: [phone]   Fax:  [phone]  Physical Therapy Treatment  Patient Details  Name: Cheyenne Bullock MRN: [id number] Date of Birth: 1960-02-04 Referring Provider: Kathyrn Lass, MD   Encounter Date: 12/19/2016  PT End of Session - 12/19/16 1445    Visit Number  13    Date for PT Re-Evaluation  12/21/16    PT Start Time  1404    PT Stop Time  1444    PT Time Calculation (min)  40 min    Activity Tolerance  Patient tolerated treatment well    Behavior During Therapy  [facility] for tasks assessed/performed       Past Medical History:  Diagnosis Date  . Abdominal pain, generalized   . Arthritis   . Cancer ([place]) 11/12/2013   thyroid removed  . Hyperlipemia   . Hypertension   . Left thyroid nodule     Past Surgical History:  Procedure Laterality Date  . CESAREAN SECTION  01/18/91  . CHOLECYSTECTOMY  11/08  . CYSTECTOMY     from both ovaries  . Uterine Ablation  2008    There were no vitals filed for this visit.  Subjective Assessment - 12/19/16 1407    Subjective  I am ready for D/C this week.  I will see the MD after I am done here.  Pt reports 75% overall improvement since the start of care.      Currently in Pain?  No/denies    Pain Score  0-No pain         [facility] PT Assessment - 12/19/16 0001      Observation/Other Assessments   Focus on Therapeutic Outcomes (FOTO)   32% limitation                  [facility] Adult PT Treatment/Exercise - 12/19/16 0001      Shoulder Exercises: Standing   External Rotation  Strengthening;Both;10 reps;Theraband    Theraband Level (Shoulder External Rotation)  Level 2 (Red)    Flexion  Strengthening;Right;20 reps;Weights    Shoulder Flexion Weight (lbs)  1#    ABduction  Strengthening;20 reps;Weights    Shoulder ABduction Weight (lbs)  1#    Shoulder Elevation  AAROM;Right;10 reps 1# on wrist, using  finger ladder    Other Standing Exercises  Rt shoulder flexion with 1# wt - waist to shoulder height shelf with eccentric control to counter.     Other Standing Exercises  roll ball up the wall: 2x10      Shoulder Exercises: Pulleys   Flexion  3 minutes    ABduction  3 minutes      Shoulder Exercises: Stretch   Corner Stretch  3 reps;20 seconds      Manual Therapy   Manual therapy comments  Kinesiotape applied with 25% stretch to Rt ant/ post deltoid; I strip of tape applied to prox to mid bicep with 50% stretch - all tape applied to decompress tissue, decrease pain and sensitivity; increase proprioception and ROM.     Soft tissue mobilization  Biceps, ant shoulder, pectoralis             PT Education - 12/19/16 1431    Education Details  demo and verbal review of weight training program.      Person(s) Educated  Patient    Methods  Explanation;Demonstration    Comprehension  Verbalized understanding;Returned demonstration       PT  Short Term Goals - 12/14/16 1130      PT SHORT TERM GOAL #1   Title  be independent in initial HEP    Time  4    Period  Weeks    Status  Achieved      PT SHORT TERM GOAL #2   Title  report a 30% reduction in Rt shoulder pain with writing and use    Time  4    Period  Weeks    Status  Achieved      PT SHORT TERM GOAL #3   Title  demonstrate Rt shoulder A/ROM to > 120 degrees to allow for reaching overhead    Time  4    Period  Weeks    Status  Achieved      PT SHORT TERM GOAL #4   Title  demonstrate Rt shoulder flexion > or = to 4/5 to allow for lifting overhead    Period  Weeks    Status  On-going        PT Long Term Goals - 12/19/16 1408      PT LONG TERM GOAL #2   Title  reduce FOTO to < or = to 29% limitation    Baseline  32% limitation    Status  Partially Met      PT LONG TERM GOAL #5   Title  report < or = to 4/10 Rt shoulder pain with writing and use    Status  Achieved            Plan - 12/19/16 1414     Clinical Impression Statement  Pt reports 75% overall improvement in Rt shoulder pain since the start of care. FOTO is improved to 32% limitation.  Pt has not yet returned to weight lifting at home.  PT modified pt's home routine to use a 1# weight in the Rt UE instead of 4# as she was doing previously.  Pt advised pt to use soup can and then 2# weight at home to progress this.  Pt will attend 1 more session to finalize HEP.      Rehab Potential  Good    PT Frequency  2x / week    PT Duration  8 weeks    PT Treatment/Interventions  ADLs/Self Care Home Management;Cryotherapy;Functional mobility training;Therapeutic activities;Therapeutic exercise;Neuromuscular re-education;Patient/family education;Passive range of motion;Manual techniques;Dry needling;Taping    PT Next Visit Plan  D/C next session.  D/C FOTO.  Finalize HEP    Consulted and Agree with Plan of Care  Patient       Patient will benefit from skilled therapeutic intervention in order to improve the following deficits and impairments:  Postural dysfunction, Decreased strength, Impaired flexibility, Pain, Impaired UE functional use, Decreased endurance, Decreased range of motion, Decreased activity tolerance  Visit Diagnosis: Chronic right shoulder pain  Stiffness of right shoulder, not elsewhere classified  Muscle weakness (generalized)  Other voice and resonance disorders     Problem List Patient Active Problem List   Diagnosis Date Noted  . Ingrown toenail 12/01/2015  . Dysphonia 11/05/2013  . Papillary carcinoma of thyroid ([place]) 11/04/2013  . Paresthesia 07/20/2011  . Hirsutism 11/24/2010  . Menopausal syndrome 11/24/2010  . Screening for malignant neoplasm of cervix 11/24/2010  . Family history of malignant neoplasm of gastrointestinal tract 06/21/2010  . Encounter for general adult medical examination without abnormal findings 05/14/2010  . Irritable bowel syndrome 05/14/2010  . HYPERLIPIDEMIA 11/13/2007  .  HYPERTENSION 07/20/2006    Claiborne Billings  Garnetta Buddy, PT 12/19/16 2:47 PM  [facility] [address], [place], [place], [address] Phone: [phone]   Fax:  [phone]  Name: Cheyenne Bullock MRN: [id number] Date of Birth: 25-Nov-1960

## 2016-12-21 ENCOUNTER — Ambulatory Visit: Payer: BC Managed Care – PPO

## 2016-12-21 DIAGNOSIS — M25511 Pain in right shoulder: Principal | ICD-10-CM

## 2016-12-21 DIAGNOSIS — M25611 Stiffness of right shoulder, not elsewhere classified: Secondary | ICD-10-CM

## 2016-12-21 DIAGNOSIS — M6281 Muscle weakness (generalized): Secondary | ICD-10-CM

## 2016-12-21 DIAGNOSIS — G8929 Other chronic pain: Secondary | ICD-10-CM

## 2016-12-21 NOTE — Therapy (Signed)
[facility] [address] [place], [place], [address] Phone: [phone]   Fax:  [phone]  Physical Therapy Treatment  Patient Details  Name: Cheyenne Bullock MRN: [id number] Date of Birth: 15-May-1960 Referring Provider: Kathyrn Lass, MD   Encounter Date: 12/21/2016  PT End of Session - 12/21/16 1441    Visit Number  14    Date for PT Re-Evaluation  12/21/16    PT Start Time  1404    PT Stop Time  1441    PT Time Calculation (min)  37 min    Activity Tolerance  Patient limited by pain    Behavior During Therapy  [facility] for tasks assessed/performed       Past Medical History:  Diagnosis Date  . Abdominal pain, generalized   . Arthritis   . Cancer ([place]) 11/12/2013   thyroid removed  . Hyperlipemia   . Hypertension   . Left thyroid nodule     Past Surgical History:  Procedure Laterality Date  . CESAREAN SECTION  01/18/91  . CHOLECYSTECTOMY  11/08  . CYSTECTOMY     from both ovaries  . Uterine Ablation  2008    There were no vitals filed for this visit.  Subjective Assessment - 12/21/16 1408    Subjective  I had a couple of bad days.  I haven't scheduled to see the MD.  Pt reported 75% overall improvement since the start of care but today is not able to rate this as she has had 2 bad days.      Patient Stated Goals  reduce Rt shoulder pain with writing and cooking, return to weight training with arms, reaching overhead    Currently in Pain?  Yes    Pain Score  5     Pain Location  Shoulder    Pain Orientation  Right    Pain Descriptors / Indicators  Aching    Pain Type  Chronic pain    Pain Onset  More than a month ago    Pain Frequency  Constant    Aggravating Factors   reaching behind, reaching out    Pain Relieving Factors  massage, tape         [facility] PT Assessment - 12/21/16 0001      Assessment   Medical Diagnosis  Rt shoulder pain      Observation/Other Assessments   Focus on Therapeutic  Outcomes (FOTO)   32% limitation      AROM   Right Shoulder Flexion  128 Degrees      Strength   Left Shoulder Flexion  4/5 pain    Left Shoulder ABduction  4-/5 pain    Left Shoulder Internal Rotation  4/5    Left Shoulder External Rotation  4/5                  [facility] Adult PT Treatment/Exercise - 12/21/16 0001      Shoulder Exercises: Standing   Flexion  Strengthening;Right;20 reps;Weights    ABduction  Strengthening;20 reps;Weights    Shoulder Elevation  AAROM;Right;10 reps 1# on wrist, using finger ladder    Other Standing Exercises  roll ball up the wall: 2x10      Shoulder Exercises: Pulleys   Flexion  3 minutes    ABduction  3 minutes      Shoulder Exercises: Stretch   Corner Stretch  3 reps;20 seconds      Manual Therapy   Manual therapy comments  Kinesiotape  applied with 25% stretch to Rt ant/ post deltoid; I strip of tape applied to prox to mid bicep with 50% stretch - all tape applied to decompress tissue, decrease pain and sensitivity; increase proprioception and ROM.     Soft tissue mobilization  Biceps, ant shoulder, pectoralis               PT Short Term Goals - 12/14/16 1130      PT SHORT TERM GOAL #1   Title  be independent in initial HEP    Time  4    Period  Weeks    Status  Achieved      PT SHORT TERM GOAL #2   Title  report a 30% reduction in Rt shoulder pain with writing and use    Time  4    Period  Weeks    Status  Achieved      PT SHORT TERM GOAL #3   Title  demonstrate Rt shoulder A/ROM to > 120 degrees to allow for reaching overhead    Time  4    Period  Weeks    Status  Achieved      PT SHORT TERM GOAL #4   Title  demonstrate Rt shoulder flexion > or = to 4/5 to allow for lifting overhead    Period  Weeks    Status  On-going        PT Long Term Goals - 12/21/16 1411      PT LONG TERM GOAL #1   Title  be indepdendent in advanced HEP    Status  Achieved      PT LONG TERM GOAL #2   Title  reduce FOTO to <  or = to 29% limitation    Baseline  32% limitation    Status  Partially Met      PT LONG TERM GOAL #3   Title  demonstrate Rt shoulder A/ROM flexion to > or = to 140 degrees to improve overhead reaching    Status  Not Met      PT LONG TERM GOAL #4   Title  demonstrate > or = to 4+/5 Rt shoulder strength to improve lifting and use with endurance activities    Status  Not Met      PT LONG TERM GOAL #5   Title  report < or = to 4/10 Rt shoulder pain with writing and use    Baseline  up to 5-6/10--this is variable      PT LONG TERM GOAL #6   Title  return to weight lifting with upper body weights without limitation    Baseline  not able to do flexion or extension against gravity with weight    Status  Partially Met            Plan - 12/21/16 1417    Clinical Impression Statement  Pt reported 75% overall improvement in Rt shoulder pain since the start of care when she was here last session.  Today, pt was unable to rate her overall improvement as she had a flare-up after last session.  Pt with limited Rt shoulder flexion and strength with testing today due to reported pain with all.  Pt has not had consistent progress with PT and has been encouraged to return to MD due to continued pain and limitations.      PT Next Visit Plan  D/C PT.  HEP was not progressed as pt with limited tolerance with current HEP  Consulted and Agree with Plan of Care  Patient       Patient will benefit from skilled therapeutic intervention in order to improve the following deficits and impairments:     Visit Diagnosis: Chronic right shoulder pain  Stiffness of right shoulder, not elsewhere classified  Muscle weakness (generalized)     Problem List Patient Active Problem List   Diagnosis Date Noted  . Ingrown toenail 12/01/2015  . Dysphonia 11/05/2013  . Papillary carcinoma of thyroid ([place]) 11/04/2013  . Paresthesia 07/20/2011  . Hirsutism 11/24/2010  . Menopausal syndrome 11/24/2010  .  Screening for malignant neoplasm of cervix 11/24/2010  . Family history of malignant neoplasm of gastrointestinal tract 06/21/2010  . Encounter for general adult medical examination without abnormal findings 05/14/2010  . Irritable bowel syndrome 05/14/2010  . HYPERLIPIDEMIA 11/13/2007  . HYPERTENSION 07/20/2006     Sigurd Sos, PT 12/21/16 2:42 PM  [facility] [address] [place], [place], [address] Phone: [phone]   Fax:  [phone]  Name: Cheyenne Bullock MRN: [id number] Date of Birth: December 30, 1960

## 2018-01-03 ENCOUNTER — Other Ambulatory Visit: Payer: Self-pay | Admitting: Family Medicine

## 2018-01-03 ENCOUNTER — Other Ambulatory Visit (HOSPITAL_COMMUNITY)
Admission: RE | Admit: 2018-01-03 | Discharge: 2018-01-03 | Disposition: A | Discharge status: Home / Self Care | Payer: BC Managed Care – PPO | Source: Ambulatory Visit | Attending: Family Medicine | Admitting: Family Medicine

## 2018-01-03 DIAGNOSIS — Z124 Encounter for screening for malignant neoplasm of cervix: Secondary | ICD-10-CM | POA: Diagnosis not present

## 2018-01-08 LAB — CYTOLOGY - PAP
Diagnosis: NEGATIVE
HPV: NOT DETECTED

## 2018-03-10 ENCOUNTER — Encounter: Payer: Self-pay | Admitting: Podiatry

## 2018-03-10 ENCOUNTER — Ambulatory Visit: Payer: BC Managed Care – PPO | Admitting: Podiatry

## 2018-03-10 DIAGNOSIS — L608 Other nail disorders: Secondary | ICD-10-CM | POA: Diagnosis not present

## 2018-03-10 NOTE — Progress Notes (Signed)
This patient presents the office for an evaluation of both big toes.  She says she is not having any pain or discomfort in the borders of the big toes.  She said she had nail surgery performed by Dr. Jacqualyn Posey 2 years ago and the nail is regrowing inward.  She says she also has nail growing into the inside and outside borders of the left big toe.  She says that there is no pain upon walking but she is concerned about the redness that has developed on the outside left big toe.  She denies any drainage from the ingrowing nails.  She presents the office more concerned about the redness then any pain that she is experiencing.  Vascular  Dorsalis pedis and posterior tibial pulses are palpable  B/L.  Capillary return  WNL.  Temperature gradient is  WNL.  Skin turgor  WNL  Sensorium  Senn Weinstein monofilament wire  WNL. Normal tactile sensation.  Nail Exam  Patient has normal nails with no evidence of bacterial or fungal infection.  Pincer toenails  Hallux  B/L.  Orthopedic  Exam  Muscle tone and muscle strength  WNL.  No limitations of motion feet  B/L.  No crepitus or joint effusion noted.  Foot type is unremarkable and digits show no abnormalities.  HAV  B/L   Skin  No open lesions.  Normal skin texture and turgor.  Pincer nails  B/l.  Return office visit.  Discussed the redness with this patient.  Explained that she has developed pincer toenails which leads to ingrowing toenails on both the medial and lateral borders both great toes.  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  After explaining the redness patient chose not to have any surgical procedure performed to her nails since there is no pain associated with the nails presently.   Gardiner Barefoot DPM

## 2018-12-25 ENCOUNTER — Other Ambulatory Visit: Payer: Self-pay | Admitting: Family Medicine

## 2018-12-25 DIAGNOSIS — E7800 Pure hypercholesterolemia, unspecified: Secondary | ICD-10-CM

## 2019-01-07 ENCOUNTER — Ambulatory Visit
Admission: RE | Admit: 2019-01-07 | Discharge: 2019-01-07 | Disposition: A | Discharge status: Home / Self Care | Payer: BC Managed Care – PPO | Source: Ambulatory Visit | Attending: Family Medicine | Admitting: Family Medicine

## 2019-01-07 DIAGNOSIS — E7800 Pure hypercholesterolemia, unspecified: Secondary | ICD-10-CM

## 2019-07-05 ENCOUNTER — Encounter: Payer: Self-pay | Admitting: Gastroenterology

## 2019-07-11 ENCOUNTER — Other Ambulatory Visit: Payer: Self-pay | Admitting: Family Medicine

## 2019-07-11 DIAGNOSIS — R1900 Intra-abdominal and pelvic swelling, mass and lump, unspecified site: Secondary | ICD-10-CM

## 2019-07-26 ENCOUNTER — Ambulatory Visit
Admission: RE | Admit: 2019-07-26 | Discharge: 2019-07-26 | Disposition: A | Discharge status: Home / Self Care | Payer: BC Managed Care – PPO | Source: Ambulatory Visit | Attending: Family Medicine | Admitting: Family Medicine

## 2019-07-26 DIAGNOSIS — R1900 Intra-abdominal and pelvic swelling, mass and lump, unspecified site: Secondary | ICD-10-CM

## 2019-08-08 ENCOUNTER — Ambulatory Visit (AMBULATORY_SURGERY_CENTER): Payer: Self-pay

## 2019-08-08 ENCOUNTER — Other Ambulatory Visit: Payer: Self-pay

## 2019-08-08 VITALS — Ht 61.5 in | Wt 130.0 lb

## 2019-08-08 DIAGNOSIS — Z800 Family history of malignant neoplasm of digestive organs: Secondary | ICD-10-CM

## 2019-08-08 NOTE — Progress Notes (Signed)
No egg or soy allergy known to patient  No issues with past sedation with any surgeries or procedures no intubation problems in the past  No diet pills per patient No home 02 use per patient  No blood thinners per patient  Pt denies issues with constipation  No A fib or A flutter  EMMI video to pt or MyChart  COVID 19 guidelines implemented in [place] today   Pt has been vaccinated for covid.  Due to the COVID-19 pandemic we are asking patients to follow these guidelines. Please only bring one care partner. Please be aware that your care partner may wait in the car in the parking lot or if they feel like they will be too hot to wait in the car, they may wait in the lobby on the 4th floor. All care partners are required to wear a mask the entire time (we do not have any that we can provide them), they need to practice social distancing, and we will do a Covid check for all patient's and care partners when you arrive. Also we will check their temperature and your temperature. If the care partner waits in their car they need to stay in the parking lot the entire time and we will call them on their cell phone when the patient is ready for discharge so they can bring the car to the front of the building. Also all patient's will need to wear a mask into building.

## 2019-08-09 ENCOUNTER — Encounter: Payer: Self-pay | Admitting: Gastroenterology

## 2019-08-29 ENCOUNTER — Encounter: Payer: BC Managed Care – PPO | Admitting: Gastroenterology

## 2019-09-05 ENCOUNTER — Ambulatory Visit (AMBULATORY_SURGERY_CENTER): Payer: BC Managed Care – PPO | Admitting: Gastroenterology

## 2019-09-05 ENCOUNTER — Encounter: Payer: Self-pay | Admitting: Gastroenterology

## 2019-09-05 ENCOUNTER — Other Ambulatory Visit: Payer: Self-pay

## 2019-09-05 VITALS — BP 119/74 | HR 76 | Temp 97.4°F | Resp 15 | Ht 61.0 in | Wt 130.0 lb

## 2019-09-05 DIAGNOSIS — D125 Benign neoplasm of sigmoid colon: Secondary | ICD-10-CM | POA: Diagnosis not present

## 2019-09-05 DIAGNOSIS — Z800 Family history of malignant neoplasm of digestive organs: Secondary | ICD-10-CM

## 2019-09-05 DIAGNOSIS — Z1211 Encounter for screening for malignant neoplasm of colon: Secondary | ICD-10-CM

## 2019-09-05 DIAGNOSIS — D122 Benign neoplasm of ascending colon: Secondary | ICD-10-CM

## 2019-09-05 MED ORDER — SODIUM CHLORIDE 0.9 % IV SOLN: 500.0000 mL | Freq: Once | INTRAVENOUS | Status: DC

## 2019-09-05 NOTE — Progress Notes (Signed)
Called to room to assist during endoscopic procedure.  Patient ID and intended procedure confirmed with present staff. Received instructions for my participation in the procedure from the performing physician.  

## 2019-09-05 NOTE — Progress Notes (Signed)
Per Dr. Silverio Decamp NO ASPIRIN, ASPIRIN CONTAINING PRODUCTS (BC OR GOODY POWDERS) OR NSAIDS (IBUPROFEN, ADVIL, ALEVE, AND MOTRIN) FOR 2 weeks; TYLENOL IS OK TO TAKE if needed.   No problems noted in the recovery room. maw

## 2019-09-05 NOTE — Patient Instructions (Addendum)
Handouts were given to you on polyps, diverticulosis, and hemorrhoids. NO ASPIRIN, ASPIRIN CONTAINING PRODUCTS (BC OR GOODY POWDERS) OR NSAIDS (IBUPROFEN, ADVIL, ALEVE, AND MOTRIN) FOR 2 weeks; TYLENOL IS OK TO TAKE if needed. You may resume your other current medications today. Await biopsy results. Please call if any questions or concerns.     YOU HAD AN ENDOSCOPIC PROCEDURE TODAY AT [facility]:   Refer to the procedure report that was given to you for any specific questions about what was found during the examination.  If the procedure report does not answer your questions, please call your gastroenterologist to clarify.  If you requested that your care partner not be given the details of your procedure findings, then the procedure report has been included in a sealed envelope for you to review at your convenience later.  YOU SHOULD EXPECT: Some feelings of bloating in the abdomen. Passage of more gas than usual.  Walking can help get rid of the air that was put into your GI tract during the procedure and reduce the bloating. If you had a lower endoscopy (such as a colonoscopy or flexible sigmoidoscopy) you may notice spotting of blood in your stool or on the toilet paper. If you underwent a bowel prep for your procedure, you may not have a normal bowel movement for a few days.  Please Note:  You might notice some irritation and congestion in your nose or some drainage.  This is from the oxygen used during your procedure.  There is no need for concern and it should clear up in a day or so.  SYMPTOMS TO REPORT IMMEDIATELY:   Following lower endoscopy (colonoscopy or flexible sigmoidoscopy):  Excessive amounts of blood in the stool  Significant tenderness or worsening of abdominal pains  Swelling of the abdomen that is new, acute  Fever of 100F or higher   For urgent or emergent issues, a gastroenterologist can be reached at any hour by calling [phone]. Do not use  MyChart messaging for urgent concerns.    DIET:  We do recommend a small meal at first, but then you may proceed to your regular diet.  Drink plenty of fluids but you should avoid alcoholic beverages for 24 hours.  ACTIVITY:  You should plan to take it easy for the rest of today and you should NOT DRIVE or use heavy machinery until tomorrow (because of the sedation medicines used during the test).    FOLLOW UP: Our staff will call the number listed on your records 48-72 hours following your procedure to check on you and address any questions or concerns that you may have regarding the information given to you following your procedure. If we do not reach you, we will leave a message.  We will attempt to reach you two times.  During this call, we will ask if you have developed any symptoms of COVID 19. If you develop any symptoms (ie: fever, flu-like symptoms, shortness of breath, cough etc.) before then, please call [phone].  If you test positive for Covid 19 in the 2 weeks post procedure, please call and report this information to [place].    If any biopsies were taken you will be contacted by phone or by letter within the next 1-3 weeks.  Please call us at [phone] if you have not heard about the biopsies in 3 weeks.    SIGNATURES/CONFIDENTIALITY: You and/or your care partner have signed paperwork which will be entered into your electronic medical  record.  These signatures attest to the fact that that the information above on your After Visit Summary has been reviewed and is understood.  Full responsibility of the confidentiality of this discharge information lies with you and/or your care-partner.

## 2019-09-05 NOTE — Progress Notes (Signed)
Report to PACU, RN, vss, BBS= Clear.  

## 2019-09-05 NOTE — Op Note (Signed)
[place] Patient Name: Cheyenne Bullock Procedure Date: 09/05/2019 10:31 AM MRN: [id number] Endoscopist: Mauri Pole , MD Age: 59 Referring MD:  Date of Birth: Jan 11, 1961 Gender: Female Account #: [id number] Procedure:                Colonoscopy Indications:              Screening in patient at increased risk: Family                            history of 1st-degree relative with colorectal                            cancer Medicines:                Monitored Anesthesia Care Procedure:                Pre-Anesthesia Assessment:                           - Prior to the procedure, a History and Physical                            was performed, and patient medications and                            allergies were reviewed. The patient's tolerance of                            previous anesthesia was also reviewed. The risks                            and benefits of the procedure and the sedation                            options and risks were discussed with the patient.                            All questions were answered, and informed consent                            was obtained. Prior Anticoagulants: The patient has                            taken no previous anticoagulant or antiplatelet                            agents. ASA Grade Assessment: II - A patient with                            mild systemic disease. After reviewing the risks                            and benefits, the patient was deemed in  satisfactory condition to undergo the procedure.                           After obtaining informed consent, the colonoscope                            was passed under direct vision. Throughout the                            procedure, the patient's blood pressure, pulse, and                            oxygen saturations were monitored continuously. The                            Colonoscope was introduced through the anus and                             advanced to the the cecum, identified by                            appendiceal orifice and ileocecal valve. The                            colonoscopy was performed without difficulty. The                            patient tolerated the procedure well. The quality                            of the bowel preparation was good. The ileocecal                            valve, appendiceal orifice, and rectum were                            photographed. Scope In: 10:41:52 AM Scope Out: 11:01:20 AM Scope Withdrawal Time: 0 hours 14 minutes 20 seconds  Total Procedure Duration: 0 hours 19 minutes 28 seconds  Findings:                 The perianal and digital rectal examinations were                            normal.                           A 5 mm polyp was found in the ascending colon. The                            polyp was sessile. The polyp was removed with a                            cold snare. Resection and retrieval were complete.  A less than 1 mm polyp was found in the ascending                            colon. The polyp was sessile. The polyp was removed                            with a cold biopsy forceps. Resection and retrieval                            were complete.                           A 7 mm polyp was found in the sigmoid colon. The                            polyp was semi-pedunculated. The polyp was removed                            with a hot snare. Resection and retrieval were                            complete.                           Scattered small-mouthed diverticula were found in                            the sigmoid colon. Peri-diverticular erythema was                            seen.                           Non-bleeding internal hemorrhoids were found during                            retroflexion. The hemorrhoids were small. Complications:            No immediate complications. Estimated Blood  Loss:     Estimated blood loss was minimal. Impression:               - One 5 mm polyp in the ascending colon, removed                            with a cold snare. Resected and retrieved.                           - One less than 1 mm polyp in the ascending colon,                            removed with a cold biopsy forceps. Resected and                            retrieved.                           -  One 7 mm polyp in the sigmoid colon, removed with                            a hot snare. Resected and retrieved.                           - Mild diverticulosis in the sigmoid colon.                            Peri-diverticular erythema was seen.                           - Non-bleeding internal hemorrhoids. Recommendation:           - Patient has a contact number available for                            emergencies. The signs and symptoms of potential                            delayed complications were discussed with the                            patient. Return to normal activities tomorrow.                            Written discharge instructions were provided to the                            patient.                           - Resume previous diet.                           - Continue present medications.                           - Await pathology results.                           - Repeat colonoscopy in 3 - 5 years for                            surveillance based on pathology results. Mauri Pole, MD 09/05/2019 11:07:47 AM This report has been signed electronically.

## 2019-09-05 NOTE — Progress Notes (Signed)
Pt's states no medical or surgical changes since previsit or office visit.  Courtney- vitals 

## 2019-09-09 ENCOUNTER — Telehealth: Payer: Self-pay | Admitting: *Deleted

## 2019-09-09 ENCOUNTER — Telehealth: Payer: Self-pay

## 2019-09-09 NOTE — Telephone Encounter (Signed)
  Follow up Call-  Call back number 09/05/2019  Post procedure Call Back phone  # [phone]  Permission to leave phone message Yes  Some recent data might be hidden     Patient questions:  Do you have a fever, pain , or abdominal swelling? No. Pain Score  0 *  Have you tolerated food without any problems? Yes.    Have you been able to return to your normal activities? Yes.    Do you have any questions about your discharge instructions: Diet   No. Medications  No. Follow up visit  No.  Do you have questions or concerns about your Care? No.  Actions: * If pain score is 4 or above: No action needed, pain <4.  1. Have you developed a fever since your procedure? no  2.   Have you had an respiratory symptoms (SOB or cough) since your procedure? no  3.   Have you tested positive for COVID 19 since your procedure no  4.   Have you had any family members/close contacts diagnosed with the COVID 19 since your procedure?  no   If yes to any of these questions please route to Joylene John, RN and Joella Prince, RN

## 2019-09-09 NOTE — Telephone Encounter (Signed)
No answer, left message to call back later today, B.Franciso Dierks RN. 

## 2019-09-13 ENCOUNTER — Encounter: Payer: Self-pay | Admitting: Gastroenterology

## 2020-02-21 ENCOUNTER — Other Ambulatory Visit: Payer: Self-pay | Admitting: Family Medicine

## 2020-02-21 DIAGNOSIS — R319 Hematuria, unspecified: Secondary | ICD-10-CM

## 2020-03-09 ENCOUNTER — Ambulatory Visit
Admission: RE | Admit: 2020-03-09 | Discharge: 2020-03-09 | Disposition: A | Discharge status: Home / Self Care | Payer: BC Managed Care – PPO | Source: Ambulatory Visit | Attending: Family Medicine | Admitting: Family Medicine

## 2020-03-09 ENCOUNTER — Other Ambulatory Visit: Payer: Self-pay

## 2020-03-09 DIAGNOSIS — R319 Hematuria, unspecified: Secondary | ICD-10-CM

## 2022-08-05 IMAGING — CT CT ABD-PELV W/O CM
1 of 2 series · 13 of 32 positions shown, 18 images · non-contrast
Comparison: CT 02/06/2015.

CLINICAL DATA: Right flank pain with stiffness and hematuria for 1
year.

EXAM:
CT ABDOMEN AND PELVIS WITHOUT CONTRAST
TECHNIQUE: Multidetector CT imaging of the abdomen and pelvis was performed
following the standard protocol without IV contrast.

[Series 2: renal standard/full · axial · 0.75mm/px · z∈[-441,-66]mm · 13 of 85 slices shown, 18 images]
[im 5/85  soft-tissue]
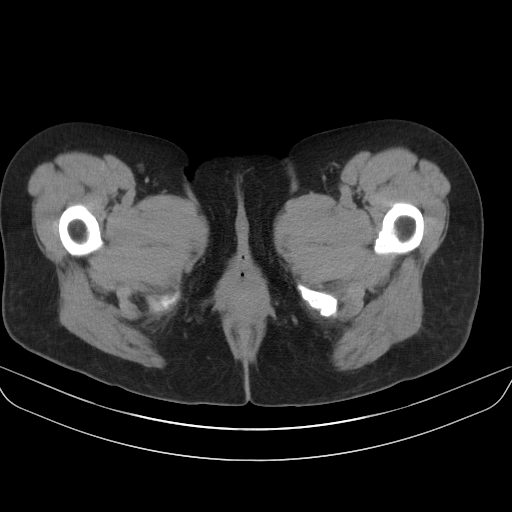
[im 5/85  bone]
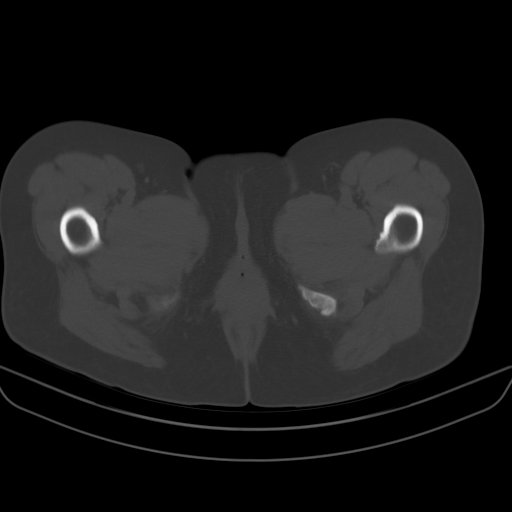
[im 15/85  soft-tissue]
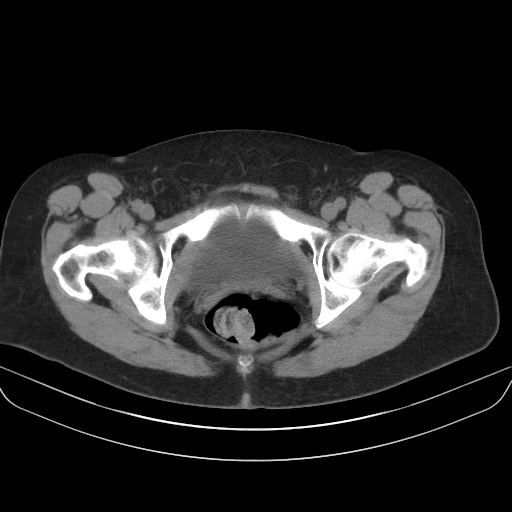
[im 19/85  soft-tissue]
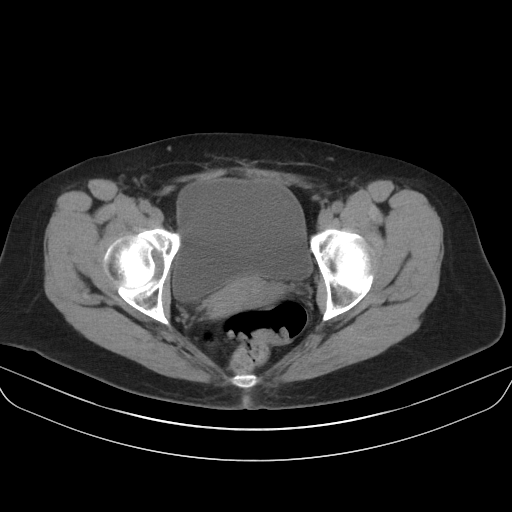
[im 24/85  soft-tissue]
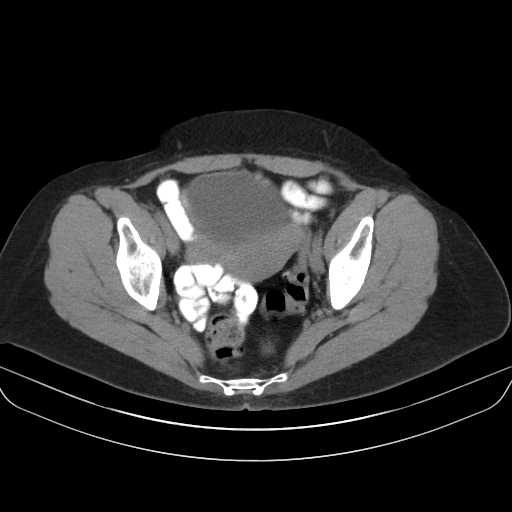
[im 33/85  soft-tissue]
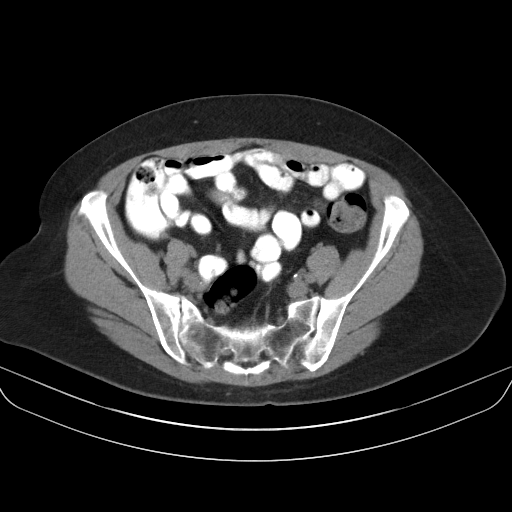
[im 38/85  soft-tissue]
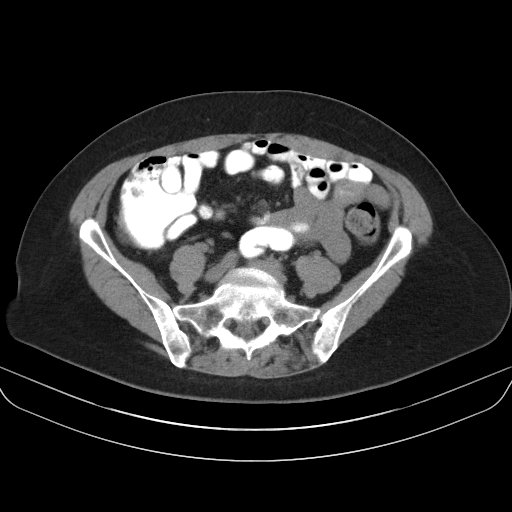
[im 47/85  soft-tissue]
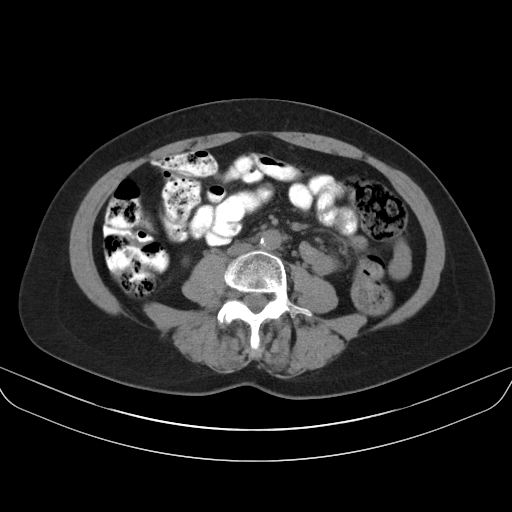
[im 52/85  soft-tissue]
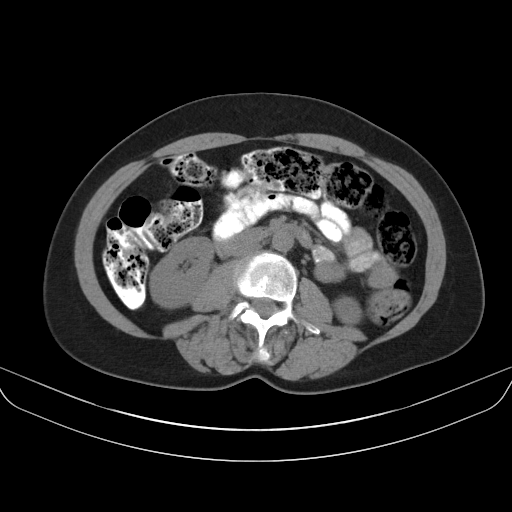
[im 61/85  soft-tissue]
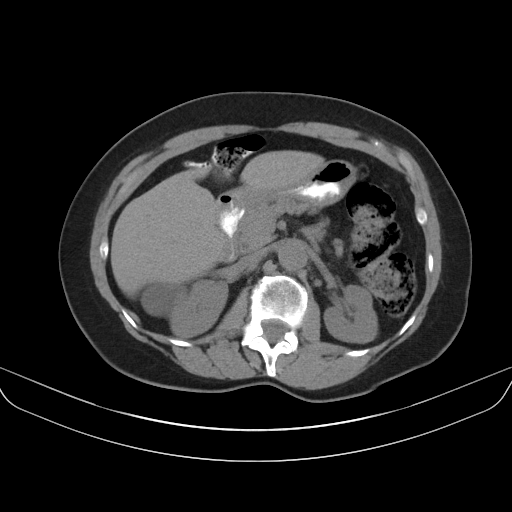
[im 61/85  bone]
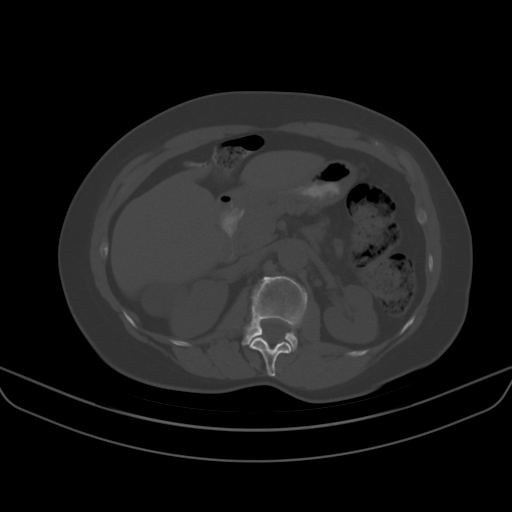
[im 66/85  soft-tissue]
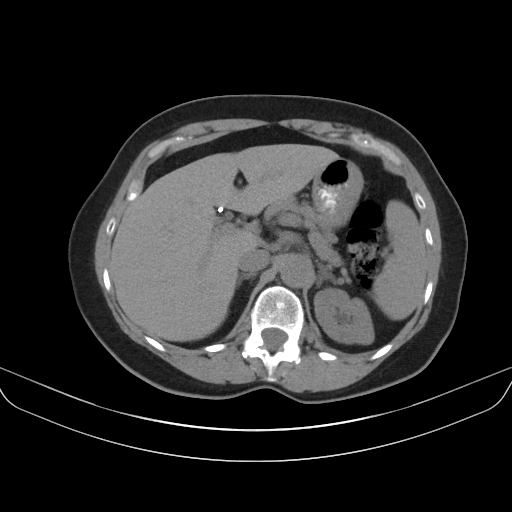
[im 66/85  lung]
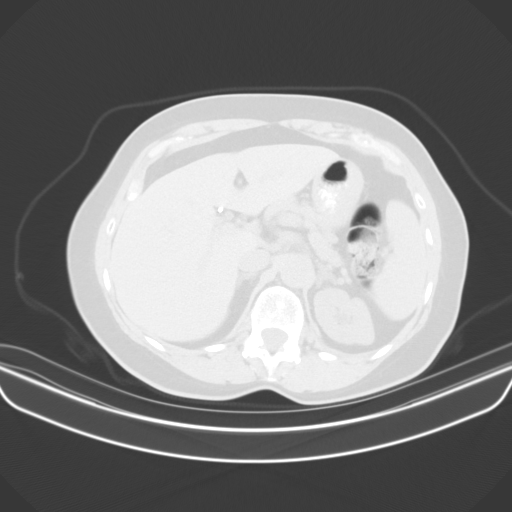
[im 71/85  soft-tissue]
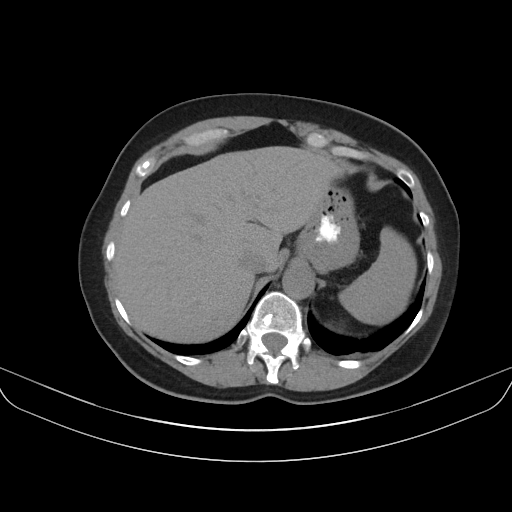
[im 71/85  lung]
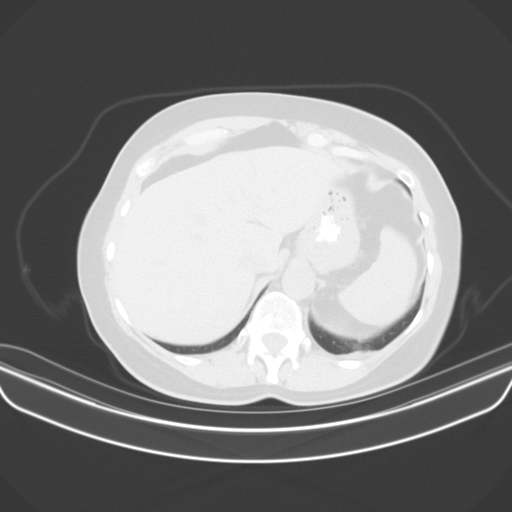
[im 75/85  lung]
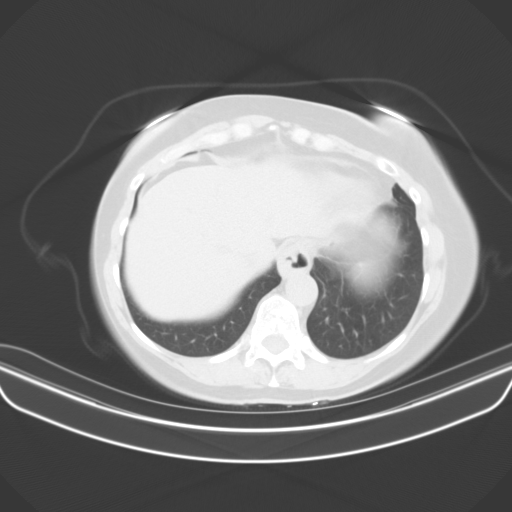
[im 80/85  soft-tissue]
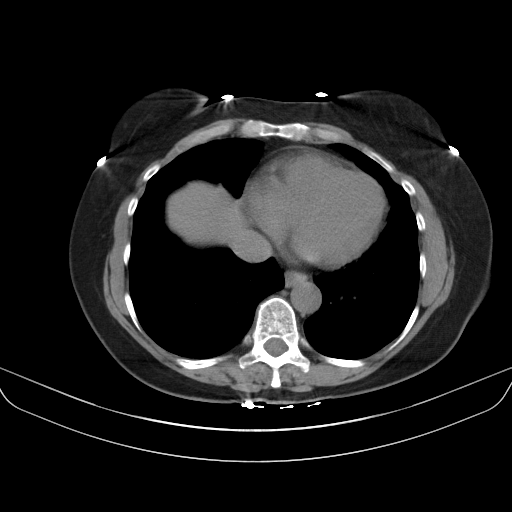
[im 80/85  lung]
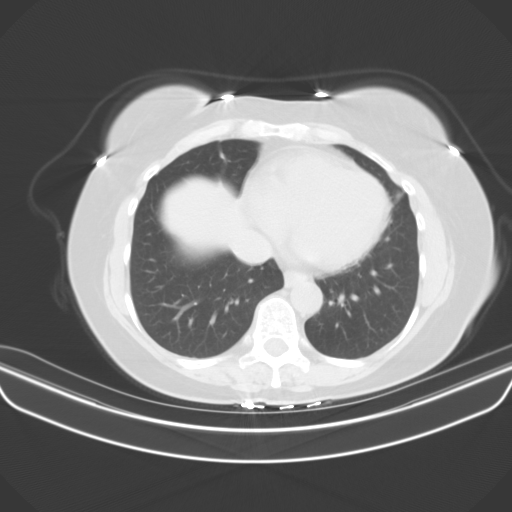

[13 of 32 positions shown; findings below may reference images not displayed]

FINDINGS: Lower chest: Clear lung bases. No significant pleural or pericardial
effusion. There is a small hiatal hernia.

Hepatobiliary: The liver appears unremarkable as imaged in the
noncontrast state. No significant biliary dilatation post
cholecystectomy.

Pancreas: Unremarkable. No pancreatic ductal dilatation or
surrounding inflammatory changes.

Spleen: Normal in size without focal abnormality.

Adrenals/Urinary Tract: Both adrenal glands appear normal. 3 mm
calcification anteriorly in the interpolar region of the right
kidney on image [DATE] was previously shown to be associated with a
small cyst which is no longer seen. No definite renal, ureteral or
bladder calculi. No hydronephrosis or perinephric soft tissue
stranding. Water density cyst in the upper pole of the right kidney
has enlarged compared with the prior CT, measuring 3.1 cm on image
27/85. In the upper pole of the left kidney, there are 2 small
exophytic lesions which have also mildly enlarged compared with the
prior study. These are suboptimally characterized based on size, but
measure water density and are likely cysts as well. The bladder
appears unremarkable.

Stomach/Bowel: Enteric contrast was administered and has passed into
the mid transverse colon. As above, there is a small hiatal hernia.
The stomach, small bowel and appendix appear normal. There is
moderate stool throughout the colon. No colonic wall thickening or
surrounding inflammation.

Vascular/Lymphatic: There are no enlarged abdominal or pelvic lymph
nodes. Mild aortoiliac atherosclerosis. No acute vascular findings
on noncontrast imaging.

Reproductive: The uterus and ovaries appear normal. No adnexal mass.

Other: No evidence of abdominal wall mass or hernia. No ascites.

Musculoskeletal: No acute or significant osseous findings. Mildly
progressive lumbar spondylosis with increased endplate degeneration
asymmetric to the right at L2-3.
IMPRESSION: 1. No acute findings or explanation for the patient's symptoms. No
evidence of urinary tract calculus or hydronephrosis.
2. Enlarging bilateral renal cysts.
3. Mildly progressive lumbar spondylosis.
4. Aortic Atherosclerosis ([id number]).

## 2022-12-01 ENCOUNTER — Encounter: Payer: Self-pay | Admitting: Gastroenterology

## 2023-04-04 NOTE — Therapy (Signed)
 OUTPATIENT PHYSICAL THERAPY LOWER EXTREMITY EVALUATION   Patient Name: Cheyenne Bullock MRN: [id number] DOB:[birth year], 63 y.o., female Today's Date: 04/05/2023  END OF SESSION:  PT End of Session - 04/05/23 1011     Visit Number 1    Date for PT Re-Evaluation 05/31/23    Authorization Type [employer]    PT Start Time 1015    PT Stop Time 1100    PT Time Calculation (min) 45 min    Activity Tolerance Patient tolerated treatment well    Behavior During Therapy WFL for tasks assessed/performed             Past Medical History:  Diagnosis Date   Abdominal pain, generalized    Allergy    seasonal   Arthritis    Cancer (HCC) 11/12/2013   thyroid removed   Hyperlipemia    Hypertension    Left thyroid nodule    thyroid removed   Past Surgical History:  Procedure Laterality Date   CESAREAN SECTION  01/18/91   CHOLECYSTECTOMY  11/08   COLONOSCOPY  04/25/2008   CYSTECTOMY     from both ovaries   Uterine Ablation  2008   Patient Active Problem List   Diagnosis Date Noted   Ingrown toenail 12/01/2015   Dysphonia 11/05/2013   Papillary carcinoma of thyroid (HCC) 11/04/2013   Paresthesia 07/20/2011   Hirsutism 11/24/2010   Menopausal syndrome 11/24/2010   Screening for malignant neoplasm of cervix 11/24/2010   Family history of malignant neoplasm of gastrointestinal tract 06/21/2010   Encounter for general adult medical examination without abnormal findings 05/14/2010   Irritable bowel syndrome 05/14/2010   HYPERLIPIDEMIA 11/13/2007   Essential hypertension 07/20/2006    PCP: Sigmund Hazel  REFERRING PROVIDER: Hurman Horn  REFERRING DIAG:  [phone] (ICD-10-CM) - Pain in left knee    THERAPY DIAG:  No diagnosis found.  Rationale for Evaluation and Treatment: Rehabilitation  ONSET DATE: 03/15/23  SUBJECTIVE:   SUBJECTIVE STATEMENT: It hurts and was swollen. In the meantime I was referred to a rheumatologist and she drained fluid so it is not as swollen as it  was. Also got a steroid shot.   PERTINENT HISTORY: HTN, hyperlipemia, arthritis, IBS  PAIN:  Are you having pain? Yes: NPRS scale: 4-5/10 Pain location: L knee, more on the lateral side Pain description: constant, more achy  Aggravating factors: going down steps, walking a lot  Relieving factors: I take tylenol if it gets bad   PRECAUTIONS: None  RED FLAGS: None   WEIGHT BEARING RESTRICTIONS: No  FALLS:  Has patient fallen in last 6 months? No  LIVING ENVIRONMENT: Lives with: lives with their family Lives in: House/apartment Stairs: Yes: Internal: 1 set steps; can reach both  OCCUPATION: works as a Clinical research associate   PLOF: Independent  PATIENT GOALS: to make the pain go away   NEXT MD VISIT: some time in April  OBJECTIVE:  Note: Objective measures were completed at Evaluation unless otherwise noted.  DIAGNOSTIC FINDINGS: had x-rays but unable to see in chart   COGNITION: Overall cognitive status: Within functional limits for tasks assessed     SENSATION: WFL  POSTURE: No Significant postural limitations  PALPATION: No TTP  LOWER EXTREMITY ROM: WNL. Can feel it in the knee on L side with flexion and extension but states it is not pain    LOWER EXTREMITY MMT: 5/5 BLE except knee flexion and extension 4+/5 on LLE    FUNCTIONAL TESTS:  5 times sit to stand: 19.21s  GAIT:  Distance walked: clinic distances Assistive device utilized: None Level of assistance: Complete Independence                                                                                                                               TREATMENT DATE: 04/05/23- EVAL    PATIENT EDUCATION:  Education details: POC, HEP Person educated: Patient Education method: Explanation Education comprehension: verbalized understanding  HOME EXERCISE PROGRAM: Access Code: [email or web address] URL: [email or web address] Date: 04/05/2023 Prepared by: Cassie Freer  Exercises - Sidelying Hip Abduction  -  1 x daily - 7 x weekly - 3 sets - 10 reps - Clamshell with Resistance  - 1 x daily - 7 x weekly - 3 sets - 10 reps - Supine Bridge with Resistance Band  - 1 x daily - 7 x weekly - 3 sets - 10 reps - Sit to Stand  - 1 x daily - 7 x weekly - 3 sets - 10 reps - Heel Raises with Counter Support  - 1 x daily - 7 x weekly - 3 sets - 10 reps  ASSESSMENT:  CLINICAL IMPRESSION: Patient is a 63 y.o. 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 States she does yoga, aerobics, or piliates at least once a day, following videos on YouTube. She reports her x-rays showed a lateral shift in her patella. Patient had fluid drained from the L knee and a steroid shot, which has helped with her pain. She presents with good ROM and strength overall, only has some slight weakness in LLE compared to R. Patient will benefit from PT to work on strengthening around L knee to be able to increase activity level and return to walking 1-39miles a day without pain.   OBJECTIVE IMPAIRMENTS: Abnormal gait, decreased strength, and pain.   ACTIVITY LIMITATIONS: squatting, stairs, and locomotion level  PARTICIPATION LIMITATIONS: community activity and yard work  [facility] POTENTIAL: Excellent  CLINICAL DECISION MAKING: Stable/uncomplicated  EVALUATION COMPLEXITY: Low  GOALS: Goals reviewed with patient? Yes  SHORT TERM GOALS: Target date: 05/03/23  Patient will be independent with initial HEP. Baseline: given 04/05/23 Goal status: INITIAL   LONG TERM GOALS: Target date: 05/31/23  Patient will be independent with advanced/ongoing HEP to improve outcomes and carryover.  Baseline:  Goal status: INITIAL  2.  Patient will report at least 50-75% improvement in L knee pain to improve QOL. (<2/10) Baseline: 4-5/10 Goal status: INITIAL  3.  Patient will demonstrate improved functional LE strength as demonstrated by 5xSTS <14s. Baseline:  19.21s Goal status: INITIAL  4.  Patient will be able to return to walking 1-2 miles a day without increased pain to access community.  Baseline: slowly getting back to walking but unable to go as far and has pain Goal status: INITIAL  5. Patient will be able to ascend/descend stairs with 1 HR and reciprocal step pattern safely to access  home and community.  Baseline: pain coming down stairs Goal status: INITIAL   PLAN:  PT FREQUENCY: 1x/week  PT DURATION: 8 weeks  PLANNED INTERVENTIONS: 97110-Therapeutic exercises, 97530- Therapeutic activity, 97112- Neuromuscular re-education, 97535- Self Care, 86578- Manual therapy, [phone]- Gait training, Patient/Family education, Balance training, Stair training, Taping, Joint mobilization, Spinal mobilization, Cryotherapy, and Moist heat  PLAN FOR NEXT SESSION: functional strengthening, maybe try taping for L lateral shift of patella   Cassie Freer, PT 04/05/2023, 10:58 AM

## 2023-04-05 ENCOUNTER — Ambulatory Visit: Attending: Sports Medicine

## 2023-04-05 DIAGNOSIS — M1712 Unilateral primary osteoarthritis, left knee: Secondary | ICD-10-CM | POA: Diagnosis present

## 2023-04-05 DIAGNOSIS — M25562 Pain in left knee: Secondary | ICD-10-CM | POA: Diagnosis present

## 2023-04-05 DIAGNOSIS — M6281 Muscle weakness (generalized): Secondary | ICD-10-CM | POA: Diagnosis present

## 2023-04-05 DIAGNOSIS — M7632 Iliotibial band syndrome, left leg: Secondary | ICD-10-CM | POA: Diagnosis present

## 2023-04-17 ENCOUNTER — Ambulatory Visit: Admitting: Physical Therapy

## 2023-04-17 ENCOUNTER — Encounter: Payer: Self-pay | Admitting: Physical Therapy

## 2023-04-17 DIAGNOSIS — M6281 Muscle weakness (generalized): Secondary | ICD-10-CM

## 2023-04-17 DIAGNOSIS — M25562 Pain in left knee: Secondary | ICD-10-CM

## 2023-04-17 DIAGNOSIS — M7632 Iliotibial band syndrome, left leg: Secondary | ICD-10-CM

## 2023-04-17 DIAGNOSIS — M1712 Unilateral primary osteoarthritis, left knee: Secondary | ICD-10-CM

## 2023-04-17 NOTE — Therapy (Signed)
 OUTPATIENT PHYSICAL THERAPY LOWER EXTREMITY EVALUATION   Patient Name: Cheyenne Bullock MRN: [id number] DOB:Aug 06, 1960, 63 y.o., female Today's Date: 04/17/2023  END OF SESSION:  PT End of Session - 04/17/23 1515     Visit Number 2    Date for PT Re-Evaluation 05/31/23    PT Start Time 1515    PT Stop Time 1600    PT Time Calculation (min) 45 min    Activity Tolerance Patient tolerated treatment well    Behavior During Therapy [facility] for tasks assessed/performed             Past Medical History:  Diagnosis Date   Abdominal pain, generalized    Allergy    seasonal   Arthritis    Cancer (HCC) 11/12/2013   thyroid removed   Hyperlipemia    Hypertension    Left thyroid nodule    thyroid removed   Past Surgical History:  Procedure Laterality Date   CESAREAN SECTION  01/18/91   CHOLECYSTECTOMY  11/08   COLONOSCOPY  04/25/2008   CYSTECTOMY     from both ovaries   Uterine Ablation  2008   Patient Active Problem List   Diagnosis Date Noted   Ingrown toenail 12/01/2015   Dysphonia 11/05/2013   Papillary carcinoma of thyroid (HCC) 11/04/2013   Paresthesia 07/20/2011   Hirsutism 11/24/2010   Menopausal syndrome 11/24/2010   Screening for malignant neoplasm of cervix 11/24/2010   Family history of malignant neoplasm of gastrointestinal tract 06/21/2010   Encounter for general adult medical examination without abnormal findings 05/14/2010   Irritable bowel syndrome 05/14/2010   HYPERLIPIDEMIA 11/13/2007   Essential hypertension 07/20/2006    PCP: Sigmund Hazel  REFERRING PROVIDER: Hurman Horn  REFERRING DIAG:  [phone] (ICD-10-CM) - Pain in left knee    THERAPY DIAG:  Acute pain of left knee  Iliotibial band syndrome of left side  Muscle weakness (generalized)  Osteoarthritis of left knee, unspecified osteoarthritis type  Rationale for Evaluation and Treatment: Rehabilitation  ONSET DATE: 03/15/23  SUBJECTIVE:   SUBJECTIVE STATEMENT: "Ok" Been doing my  exercises daily  PERTINENT HISTORY: HTN, hyperlipemia, arthritis, IBS  PAIN:  Are you having pain? Yes: NPRS scale: 0/10 Pain location: L knee, more on the lateral side Pain description: constant, more achy  Aggravating factors: going down steps, walking a lot  Relieving factors: I take tylenol if it gets bad   PRECAUTIONS: None  RED FLAGS: None   WEIGHT BEARING RESTRICTIONS: No  FALLS:  Has patient fallen in last 6 months? No  LIVING ENVIRONMENT: Lives with: lives with their family Lives in: House/apartment Stairs: Yes: Internal: 1 set steps; can reach both  OCCUPATION: works as a Clinical research associate   PLOF: Independent  PATIENT GOALS: to make the pain go away   NEXT MD VISIT: some time in April  OBJECTIVE:  Note: Objective measures were completed at Evaluation unless otherwise noted.  DIAGNOSTIC FINDINGS: had x-rays but unable to see in chart   COGNITION: Overall cognitive status: Within functional limits for tasks assessed     SENSATION: WFL  POSTURE: No Significant postural limitations  PALPATION: No TTP  LOWER EXTREMITY ROM: WNL. Can feel it in the knee on L side with flexion and extension but states it is not pain    LOWER EXTREMITY MMT: 5/5 BLE except knee flexion and extension 4+/5 on LLE    FUNCTIONAL TESTS:  5 times sit to stand: 19.21s  GAIT: Distance walked: clinic distances Assistive device utilized: None Level of assistance: Complete  Independence                                                                                                                               TREATMENT DATE:  04/17/23 NuStep L5 x 6 min L Knee PROM w/ end range holds  Patellar mobs LLE LLE LAQ 3lb 2x10 LLE HS curls green 2x10 S2S x10, holding yellow ball x 10 6in step ups x10 each Lateral step ups 4in x 10 each Heel raises black 2x10 20lb resisted side steps x5 each Seated HS stretch   04/05/23- EVAL    PATIENT EDUCATION:  Education details: POC,  HEP Person educated: Patient Education method: Explanation Education comprehension: verbalized understanding  HOME EXERCISE PROGRAM: Access Code: [email or web address] URL: [email or web address] Date: 04/05/2023 Prepared by: Cassie Freer  Exercises - Sidelying Hip Abduction  - 1 x daily - 7 x weekly - 3 sets - 10 reps - Clamshell with Resistance  - 1 x daily - 7 x weekly - 3 sets - 10 reps - Supine Bridge with Resistance Band  - 1 x daily - 7 x weekly - 3 sets - 10 reps - Sit to Stand  - 1 x daily - 7 x weekly - 3 sets - 10 reps - Heel Raises with Counter Support  - 1 x daily - 7 x weekly - 3 sets - 10 reps  ASSESSMENT:  CLINICAL IMPRESSION: Patient is a 63 y.o. 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  She reports her x-rays showed a lateral shift in her patella. Pt enters doing well, she reports improved mobility from HEP. She did well with an introduction to LE strength and stability interventions. Cue to increase ROM with HS curls and to squeeze quad with LAQ. Some initial instability with resisted side steps. Patient will benefit from PT to work on strengthening around L knee to be able to increase activity level and return to walking 1-56miles a day without pain.   OBJECTIVE IMPAIRMENTS: Abnormal gait, decreased strength, and pain.   ACTIVITY LIMITATIONS: squatting, stairs, and locomotion level  PARTICIPATION LIMITATIONS: community activity and yard work  [facility] POTENTIAL: Excellent  CLINICAL DECISION MAKING: Stable/uncomplicated  EVALUATION COMPLEXITY: Low  GOALS: Goals reviewed with patient? Yes  SHORT TERM GOALS: Target date: 05/03/23  Patient will be independent with initial HEP. Baseline: given 04/05/23 Goal status: Met 04/17/23   LONG TERM GOALS: Target date: 05/31/23  Patient will be independent with advanced/ongoing HEP to improve outcomes and carryover.  Baseline:  Goal status:  INITIAL  2.  Patient will report at least 50-75% improvement in L knee pain to improve QOL. (<2/10) Baseline: 4-5/10 Goal status: INITIAL  3.  Patient will demonstrate improved functional LE strength as demonstrated by 5xSTS <14s. Baseline: 19.21s Goal status: INITIAL  4.  Patient will be able to return to walking 1-2 miles a day without increased pain to access  community.  Baseline: slowly getting back to walking but unable to go as far and has pain Goal status: INITIAL  5. Patient will be able to ascend/descend stairs with 1 HR and reciprocal step pattern safely to access home and community.  Baseline: pain coming down stairs Goal status: INITIAL   PLAN:  PT FREQUENCY: 1x/week  PT DURATION: 8 weeks  PLANNED INTERVENTIONS: 97110-Therapeutic exercises, 97530- Therapeutic activity, 97112- Neuromuscular re-education, 97535- Self Care, 16109- Manual therapy, [phone]- Gait training, Patient/Family education, Balance training, Stair training, Taping, Joint mobilization, Spinal mobilization, Cryotherapy, and Moist heat  PLAN FOR NEXT SESSION: functional strengthening, maybe try taping for L lateral shift of patella   Grayce Sessions, PTA 04/17/2023, 3:15 PM

## 2023-04-26 ENCOUNTER — Ambulatory Visit: Attending: Sports Medicine | Admitting: Physical Therapy

## 2023-04-26 ENCOUNTER — Encounter: Payer: Self-pay | Admitting: Physical Therapy

## 2023-04-26 DIAGNOSIS — M6281 Muscle weakness (generalized): Secondary | ICD-10-CM | POA: Insufficient documentation

## 2023-04-26 DIAGNOSIS — M7632 Iliotibial band syndrome, left leg: Secondary | ICD-10-CM | POA: Insufficient documentation

## 2023-04-26 DIAGNOSIS — M1712 Unilateral primary osteoarthritis, left knee: Secondary | ICD-10-CM | POA: Diagnosis present

## 2023-04-26 DIAGNOSIS — M25562 Pain in left knee: Secondary | ICD-10-CM | POA: Insufficient documentation

## 2023-04-26 NOTE — Therapy (Signed)
 OUTPATIENT PHYSICAL THERAPY LOWER EXTREMITY TREATMENT   Patient Name: Cheyenne Bullock MRN: [id number] DOB:January 19, 1960, 63 y.o., female Today's Date: 04/26/2023  END OF SESSION:  PT End of Session - 04/26/23 1432     Visit Number 3    Date for PT Re-Evaluation 05/31/23    PT Start Time 1430    PT Stop Time 1515    PT Time Calculation (min) 45 min             Past Medical History:  Diagnosis Date   Abdominal pain, generalized    Allergy    seasonal   Arthritis    Cancer (HCC) 11/12/2013   thyroid removed   Hyperlipemia    Hypertension    Left thyroid nodule    thyroid removed   Past Surgical History:  Procedure Laterality Date   CESAREAN SECTION  01/18/91   CHOLECYSTECTOMY  11/08   COLONOSCOPY  04/25/2008   CYSTECTOMY     from both ovaries   Uterine Ablation  2008   Patient Active Problem List   Diagnosis Date Noted   Ingrown toenail 12/01/2015   Dysphonia 11/05/2013   Papillary carcinoma of thyroid (HCC) 11/04/2013   Paresthesia 07/20/2011   Hirsutism 11/24/2010   Menopausal syndrome 11/24/2010   Screening for malignant neoplasm of cervix 11/24/2010   Family history of malignant neoplasm of gastrointestinal tract 06/21/2010   Encounter for general adult medical examination without abnormal findings 05/14/2010   Irritable bowel syndrome 05/14/2010   HYPERLIPIDEMIA 11/13/2007   Essential hypertension 07/20/2006    PCP: Sigmund Hazel  REFERRING PROVIDER: Hurman Horn  REFERRING DIAG:  [phone] (ICD-10-CM) - Pain in left knee    THERAPY DIAG:  Acute pain of left knee  Iliotibial band syndrome of left side  Muscle weakness (generalized)  Osteoarthritis of left knee, unspecified osteoarthritis type  Rationale for Evaluation and Treatment: Rehabilitation  ONSET DATE: 03/15/23  SUBJECTIVE:  No pain at beginning of visit but long distance walking sometimes causes pain. She was sore after last visit but hasn't had any major pain since.  SUBJECTIVE  STATEMENT: "Ok" Been doing my exercises daily  PERTINENT HISTORY: HTN, hyperlipemia, arthritis, IBS  PAIN:  Are you having pain? Yes: NPRS scale: 0/10 Pain location: L knee, more on the lateral side Pain description: constant, more achy  Aggravating factors: going down steps, walking a lot  Relieving factors: I take tylenol if it gets bad   PRECAUTIONS: None  RED FLAGS: None   WEIGHT BEARING RESTRICTIONS: No  FALLS:  Has patient fallen in last 6 months? No  LIVING ENVIRONMENT: Lives with: lives with their family Lives in: House/apartment Stairs: Yes: Internal: 1 set steps; can reach both  OCCUPATION: works as a Clinical research associate   PLOF: Independent  PATIENT GOALS: to make the pain go away   NEXT MD VISIT: some time in April  OBJECTIVE:  Note: Objective measures were completed at Evaluation unless otherwise noted.  DIAGNOSTIC FINDINGS: had x-rays but unable to see in chart   COGNITION: Overall cognitive status: Within functional limits for tasks assessed     SENSATION: WFL  POSTURE: No Significant postural limitations  PALPATION: No TTP  LOWER EXTREMITY ROM: WNL. Can feel it in the knee on L side with flexion and extension but states it is not pain    LOWER EXTREMITY MMT: 5/5 BLE except knee flexion and extension 4+/5 on LLE    FUNCTIONAL TESTS:  5 times sit to stand: 19.21s  GAIT: Distance walked: clinic distances Assistive  device utilized: None Level of assistance: Complete Independence                                                                                                                               TREATMENT DATE:  04/26/23 NuStep L4 L knee PROM with end range holds  Patellar mobs all directions Resisted Knee flex and ext. Green band x10 Sit to stand x2 with yellow ball Lateral band walks blue band x3 Step ups x10 each side Heel raises 2x10 Resisted 6in step up/ lateral step up x10 each HS stretch DF stretch Balance on airex weight  shifts, reaching to targets  04/17/23 NuStep L5 x 6 min L Knee PROM w/ end range holds  Patellar mobs LLE LLE LAQ 3lb 2x10 LLE HS curls green 2x10 S2S x10, holding yellow ball x 10 6in step ups x10 each Lateral step ups 4in x 10 each Heel raises black 2x10 20lb resisted side steps x5 each Seated HS stretch   04/05/23- EVAL    PATIENT EDUCATION:  Education details: POC, HEP Person educated: Patient Education method: Explanation Education comprehension: verbalized understanding  HOME EXERCISE PROGRAM: Access Code: [email or web address] URL: [email or web address] Date: 04/05/2023 Prepared by: Cassie Freer  Exercises - Sidelying Hip Abduction  - 1 x daily - 7 x weekly - 3 sets - 10 reps - Clamshell with Resistance  - 1 x daily - 7 x weekly - 3 sets - 10 reps - Supine Bridge with Resistance Band  - 1 x daily - 7 x weekly - 3 sets - 10 reps - Sit to Stand  - 1 x daily - 7 x weekly - 3 sets - 10 reps - Heel Raises with Counter Support  - 1 x daily - 7 x weekly - 3 sets - 10 reps  ASSESSMENT:  CLINICAL IMPRESSION: Patient is a 63 y.o. 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  She reports her x-rays showed a lateral shift in her patella. Pt enters doing well, she reports improved mobility from HEP daily. We did Functional strengthening exercises to improve LE strength and stability. Some initial instability with resisted lateral step ups but regained stability with verbal cues. Tactiel cues for LE positioning with sit to stand. Good stability on non compliant surfaces. Patient will benefit from PT to work on strengthening around L knee to be able to increase activity level and return to walking 1-31miles a day without pain.   OBJECTIVE IMPAIRMENTS: Abnormal gait, decreased strength, and pain.   ACTIVITY LIMITATIONS: squatting, stairs, and locomotion level  PARTICIPATION LIMITATIONS: community activity and  yard work  [facility] POTENTIAL: Excellent  CLINICAL DECISION MAKING: Stable/uncomplicated  EVALUATION COMPLEXITY: Low  GOALS: Goals reviewed with patient? Yes  SHORT TERM GOALS: Target date: 05/03/23  Patient will be independent with initial HEP. Baseline: given 04/05/23 Goal status: Met 04/17/23   LONG TERM GOALS: Target date:  05/31/23  Patient will be independent with advanced/ongoing HEP to improve outcomes and carryover.  Baseline:  Goal status: INITIAL  2.  Patient will report at least 50-75% improvement in L knee pain to improve QOL. (<2/10) Baseline: 4-5/10 Goal status: INITIAL  3.  Patient will demonstrate improved functional LE strength as demonstrated by 5xSTS <14s. Baseline: 19.21s Goal status: INITIAL  4.  Patient will be able to return to walking 1-2 miles a day without increased pain to access community.  Baseline: slowly getting back to walking but unable to go as far and has pain Goal status: INITIAL  5. Patient will be able to ascend/descend stairs with 1 HR and reciprocal step pattern safely to access home and community.  Baseline: pain coming down stairs Goal status: INITIAL   PLAN:  PT FREQUENCY: 1x/week  PT DURATION: 8 weeks  PLANNED INTERVENTIONS: 97110-Therapeutic exercises, 97530- Therapeutic activity, 97112- Neuromuscular re-education, 97535- Self Care, 16109- Manual therapy, [phone]- Gait training, Patient/Family education, Balance training, Stair training, Taping, Joint mobilization, Spinal mobilization, Cryotherapy, and Moist heat  PLAN FOR NEXT SESSION: functional strengthening, maybe try taping for L lateral shift of patella   Grayce Sessions, PTA 04/26/2023, 2:33 PM

## 2023-05-04 ENCOUNTER — Ambulatory Visit

## 2023-05-04 DIAGNOSIS — M7632 Iliotibial band syndrome, left leg: Secondary | ICD-10-CM

## 2023-05-04 DIAGNOSIS — M6281 Muscle weakness (generalized): Secondary | ICD-10-CM

## 2023-05-04 DIAGNOSIS — M25562 Pain in left knee: Secondary | ICD-10-CM | POA: Diagnosis not present

## 2023-05-04 DIAGNOSIS — M1712 Unilateral primary osteoarthritis, left knee: Secondary | ICD-10-CM

## 2023-05-04 NOTE — Therapy (Signed)
 OUTPATIENT PHYSICAL THERAPY LOWER EXTREMITY TREATMENT   Patient Name: Cheyenne Bullock MRN: [id number] DOB:[birth year], 63 y.o., female Today's Date: 05/04/2023  END OF SESSION:  PT End of Session - 05/04/23 1630     Visit Number 4    Date for PT Re-Evaluation 05/31/23    PT Start Time 1630    PT Stop Time 1715    PT Time Calculation (min) 45 min              Past Medical History:  Diagnosis Date   Abdominal pain, generalized    Allergy    seasonal   Arthritis    Cancer (HCC) 11/12/2013   thyroid removed   Hyperlipemia    Hypertension    Left thyroid nodule    thyroid removed   Past Surgical History:  Procedure Laterality Date   CESAREAN SECTION  01/18/91   CHOLECYSTECTOMY  11/08   COLONOSCOPY  04/25/2008   CYSTECTOMY     from both ovaries   Uterine Ablation  2008   Patient Active Problem List   Diagnosis Date Noted   Ingrown toenail 12/01/2015   Dysphonia 11/05/2013   Papillary carcinoma of thyroid (HCC) 11/04/2013   Paresthesia 07/20/2011   Hirsutism 11/24/2010   Menopausal syndrome 11/24/2010   Screening for malignant neoplasm of cervix 11/24/2010   Family history of malignant neoplasm of gastrointestinal tract 06/21/2010   Encounter for general adult medical examination without abnormal findings 05/14/2010   Irritable bowel syndrome 05/14/2010   HYPERLIPIDEMIA 11/13/2007   Essential hypertension 07/20/2006    PCP: Sigmund Hazel  REFERRING PROVIDER: Hurman Horn  REFERRING DIAG:  [phone] (ICD-10-CM) - Pain in left knee    THERAPY DIAG:  Acute pain of left knee  Iliotibial band syndrome of left side  Muscle weakness (generalized)  Osteoarthritis of left knee, unspecified osteoarthritis type  Rationale for Evaluation and Treatment: Rehabilitation  ONSET DATE: 03/15/23  SUBJECTIVE:  No pain at beginning of visit but long distance walking sometimes causes pain. She was sore after last visit but hasn't had any major pain since.  SUBJECTIVE  STATEMENT: Pain just comes and goes at random times.   PERTINENT HISTORY: HTN, hyperlipemia, arthritis, IBS  PAIN:  Are you having pain? Yes: NPRS scale: 0/10 Pain location: L knee, more on the lateral side Pain description: constant, more achy  Aggravating factors: going down steps, walking a lot  Relieving factors: I take tylenol if it gets bad   PRECAUTIONS: None  RED FLAGS: None   WEIGHT BEARING RESTRICTIONS: No  FALLS:  Has patient fallen in last 6 months? No  LIVING ENVIRONMENT: Lives with: lives with their family Lives in: House/apartment Stairs: Yes: Internal: 1 set steps; can reach both  OCCUPATION: works as a Clinical research associate   PLOF: Independent  PATIENT GOALS: to make the pain go away   NEXT MD VISIT: some time in April  OBJECTIVE:  Note: Objective measures were completed at Evaluation unless otherwise noted.  DIAGNOSTIC FINDINGS: had x-rays but unable to see in chart   COGNITION: Overall cognitive status: Within functional limits for tasks assessed     SENSATION: WFL  POSTURE: No Significant postural limitations  PALPATION: No TTP  LOWER EXTREMITY ROM: WNL. Can feel it in the knee on L side with flexion and extension but states it is not pain    LOWER EXTREMITY MMT: 5/5 BLE except knee flexion and extension 4+/5 on LLE    FUNCTIONAL TESTS:  5 times sit to stand: 19.21s  GAIT: Distance  walked: clinic distances Assistive device utilized: None Level of assistance: Complete Independence                                                                                                                               TREATMENT DATE:  05/04/23 NuStep L5x51mins  LAQ 3# 2x10 HS curls red 2x10 STS 2x10 Step ups 6" forward and lateral Calf raises 2x12 Calf stretch 15s x2  Resisted gait 20# 4 way x4   04/26/23 NuStep L4 L knee PROM with end range holds  Patellar mobs all directions Resisted Knee flex and ext. Green band x10 Sit to stand x2 with  yellow ball Lateral band walks blue band x3 Step ups x10 each side Heel raises 2x10 Resisted 6in step up/ lateral step up x10 each HS stretch DF stretch Balance on airex weight shifts, reaching to targets  04/17/23 NuStep L5 x 6 min L Knee PROM w/ end range holds  Patellar mobs LLE LLE LAQ 3lb 2x10 LLE HS curls green 2x10 S2S x10, holding yellow ball x 10 6in step ups x10 each Lateral step ups 4in x 10 each Heel raises black 2x10 20lb resisted side steps x5 each Seated HS stretch   04/05/23- EVAL    PATIENT EDUCATION:  Education details: POC, HEP Person educated: Patient Education method: Explanation Education comprehension: verbalized understanding  HOME EXERCISE PROGRAM: Access Code: [email or web address] URL: [email or web address] Date: 04/05/2023 Prepared by: Donavon Fudge  Exercises - Sidelying Hip Abduction  - 1 x daily - 7 x weekly - 3 sets - 10 reps - Clamshell with Resistance  - 1 x daily - 7 x weekly - 3 sets - 10 reps - Supine Bridge with Resistance Band  - 1 x daily - 7 x weekly - 3 sets - 10 reps - Sit to Stand  - 1 x daily - 7 x weekly - 3 sets - 10 reps - Heel Raises with Counter Support  - 1 x daily - 7 x weekly - 3 sets - 10 reps  ASSESSMENT:  CLINICAL IMPRESSION: Patient reports some pain that comes and goes. With LAQ and HS curls she has no pain on her L side, but instead feels it on the R today. Tried to do the leg press today but she is unable to push the 20# and states she could feel it in her back. Will benefit from ongoing strengthening.    Patient is a 63 y.o. 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  She reports her x-rays showed a lateral shift in her patella. Pt enters doing well, she reports improved mobility from HEP daily. We did Functional strengthening exercises to improve LE strength and stability. Some initial instability with resisted lateral step ups but  regained stability with verbal cues. Tactiel cues for LE positioning with sit to stand. Good stability on non compliant surfaces. Patient will benefit from  PT to work on strengthening around L knee to be able to increase activity level and return to walking 1-13miles a day without pain.   OBJECTIVE IMPAIRMENTS: Abnormal gait, decreased strength, and pain.   ACTIVITY LIMITATIONS: squatting, stairs, and locomotion level  PARTICIPATION LIMITATIONS: community activity and yard work  [facility] POTENTIAL: Excellent  CLINICAL DECISION MAKING: Stable/uncomplicated  EVALUATION COMPLEXITY: Low  GOALS: Goals reviewed with patient? Yes  SHORT TERM GOALS: Target date: 05/03/23  Patient will be independent with initial HEP. Baseline: given 04/05/23 Goal status: Met 04/17/23   LONG TERM GOALS: Target date: 05/31/23  Patient will be independent with advanced/ongoing HEP to improve outcomes and carryover.  Baseline:  Goal status: INITIAL  2.  Patient will report at least 50-75% improvement in L knee pain to improve QOL. (<2/10) Baseline: 4-5/10 Goal status: INITIAL  3.  Patient will demonstrate improved functional LE strength as demonstrated by 5xSTS <14s. Baseline: 19.21s Goal status: INITIAL  4.  Patient will be able to return to walking 1-2 miles a day without increased pain to access community.  Baseline: slowly getting back to walking but unable to go as far and has pain Goal status: INITIAL  5. Patient will be able to ascend/descend stairs with 1 HR and reciprocal step pattern safely to access home and community.  Baseline: pain coming down stairs Goal status: INITIAL   PLAN:  PT FREQUENCY: 1x/week  PT DURATION: 8 weeks  PLANNED INTERVENTIONS: 97110-Therapeutic exercises, 97530- Therapeutic activity, 97112- Neuromuscular re-education, 97535- Self Care, [id number]- Manual therapy, [phone]- Gait training, Patient/Family education, Balance training, Stair training, Taping, Joint mobilization,  Spinal mobilization, Cryotherapy, and Moist heat  PLAN FOR NEXT SESSION: functional strengthening, maybe try taping for L lateral shift of patella   Donavon Fudge, PT 05/04/2023, 5:14 PM

## 2023-05-12 ENCOUNTER — Ambulatory Visit: Admitting: Physical Therapy

## 2023-05-31 ENCOUNTER — Encounter: Payer: Self-pay | Admitting: Physical Therapy

## 2023-05-31 ENCOUNTER — Emergency Department (HOSPITAL_BASED_OUTPATIENT_CLINIC_OR_DEPARTMENT_OTHER)
Admission: EM | Admit: 2023-05-31 | Discharge: 2023-05-31 | Disposition: A | Discharge status: Home / Self Care | Attending: Emergency Medicine | Admitting: Emergency Medicine

## 2023-05-31 ENCOUNTER — Other Ambulatory Visit: Payer: Self-pay

## 2023-05-31 ENCOUNTER — Encounter (HOSPITAL_BASED_OUTPATIENT_CLINIC_OR_DEPARTMENT_OTHER): Payer: Self-pay | Admitting: Emergency Medicine

## 2023-05-31 ENCOUNTER — Ambulatory Visit: Attending: Sports Medicine | Admitting: Physical Therapy

## 2023-05-31 DIAGNOSIS — M1712 Unilateral primary osteoarthritis, left knee: Secondary | ICD-10-CM | POA: Diagnosis present

## 2023-05-31 DIAGNOSIS — S61411A Laceration without foreign body of right hand, initial encounter: Secondary | ICD-10-CM

## 2023-05-31 DIAGNOSIS — S61210A Laceration without foreign body of right index finger without damage to nail, initial encounter: Secondary | ICD-10-CM | POA: Insufficient documentation

## 2023-05-31 DIAGNOSIS — Z23 Encounter for immunization: Secondary | ICD-10-CM | POA: Insufficient documentation

## 2023-05-31 DIAGNOSIS — Z859 Personal history of malignant neoplasm, unspecified: Secondary | ICD-10-CM | POA: Insufficient documentation

## 2023-05-31 DIAGNOSIS — M25562 Pain in left knee: Secondary | ICD-10-CM | POA: Diagnosis present

## 2023-05-31 DIAGNOSIS — I10 Essential (primary) hypertension: Secondary | ICD-10-CM | POA: Diagnosis not present

## 2023-05-31 DIAGNOSIS — M6281 Muscle weakness (generalized): Secondary | ICD-10-CM | POA: Diagnosis present

## 2023-05-31 DIAGNOSIS — M7632 Iliotibial band syndrome, left leg: Secondary | ICD-10-CM | POA: Diagnosis present

## 2023-05-31 DIAGNOSIS — W268XXA Contact with other sharp object(s), not elsewhere classified, initial encounter: Secondary | ICD-10-CM | POA: Diagnosis not present

## 2023-05-31 MED ORDER — TETANUS-DIPHTH-ACELL PERTUSSIS 5-2.5-18.5 LF-MCG/0.5 IM SUSY
0.5000 mL | PREFILLED_SYRINGE | Freq: Once | INTRAMUSCULAR | Status: AC
  Administered 2023-05-31: 0.5 mL via INTRAMUSCULAR
  Filled 2023-05-31: qty 0.5

## 2023-05-31 NOTE — ED Notes (Signed)
 Suture cart at bedside

## 2023-05-31 NOTE — Therapy (Signed)
 OUTPATIENT PHYSICAL THERAPY LOWER EXTREMITY TREATMENT   Patient Name: Cheyenne Bullock MRN: [id number] DOB:11-Feb-1960, 63 y.o., female Today's Date: 05/31/2023  END OF SESSION:  PT End of Session - 05/31/23 1014     Visit Number 5    Date for PT Re-Evaluation 05/31/23    Authorization Type [employer]    PT Start Time 1014    PT Stop Time 1058    PT Time Calculation (min) 44 min    Activity Tolerance Patient tolerated treatment well    Behavior During Therapy WFL for tasks assessed/performed              Past Medical History:  Diagnosis Date   Abdominal pain, generalized    Allergy    seasonal   Arthritis    Cancer (HCC) 11/12/2013   thyroid  removed   Hyperlipemia    Hypertension    Left thyroid  nodule    thyroid  removed   Past Surgical History:  Procedure Laterality Date   CESAREAN SECTION  01/18/91   CHOLECYSTECTOMY  11/08   COLONOSCOPY  04/25/2008   CYSTECTOMY     from both ovaries   Uterine Ablation  2008   Patient Active Problem List   Diagnosis Date Noted   Ingrown toenail 12/01/2015   Dysphonia 11/05/2013   Papillary carcinoma of thyroid  (HCC) 11/04/2013   Paresthesia 07/20/2011   Hirsutism 11/24/2010   Menopausal syndrome 11/24/2010   Screening for malignant neoplasm of cervix 11/24/2010   Family history of malignant neoplasm of gastrointestinal tract 06/21/2010   Encounter for general adult medical examination without abnormal findings 05/14/2010   Irritable bowel syndrome 05/14/2010   HYPERLIPIDEMIA 11/13/2007   Essential hypertension 07/20/2006    PCP: Perley Bradley  REFERRING PROVIDER: Rance Burrows  REFERRING DIAG:  [phone] (ICD-10-CM) - Pain in left knee    THERAPY DIAG:  Acute pain of left knee  Iliotibial band syndrome of left side  Muscle weakness (generalized)  Osteoarthritis of left knee, unspecified osteoarthritis type  Rationale for Evaluation and Treatment: Rehabilitation  ONSET DATE: 03/15/23  SUBJECTIVE:  Reports that  she is feeling good, would like to make today her last.  Want's tpo know what to do at home  SUBJECTIVE STATEMENT: Pain just comes and goes at random times.   PERTINENT HISTORY: HTN, hyperlipemia, arthritis, IBS  PAIN:  Are you having pain? Yes: NPRS scale: 0/10 Pain location: L knee, more on the lateral side Pain description: constant, more achy  Aggravating factors: going down steps, walking a lot  Relieving factors: I take tylenol if it gets bad   PRECAUTIONS: None  RED FLAGS: None   WEIGHT BEARING RESTRICTIONS: No  FALLS:  Has patient fallen in last 6 months? No  LIVING ENVIRONMENT: Lives with: lives with their family Lives in: House/apartment Stairs: Yes: Internal: 1 set steps; can reach both  OCCUPATION: works as a Clinical research associate   PLOF: Independent  PATIENT GOALS: to make the pain go away   NEXT MD VISIT: some time in April  OBJECTIVE:  Note: Objective measures were completed at Evaluation unless otherwise noted.  DIAGNOSTIC FINDINGS: had x-rays but unable to see in chart   COGNITION: Overall cognitive status: Within functional limits for tasks assessed     SENSATION: WFL  POSTURE: No Significant postural limitations  PALPATION: No TTP  LOWER EXTREMITY ROM: WNL. Can feel it in the knee on L side with flexion and extension but states it is not pain    LOWER EXTREMITY MMT: 5/5 BLE except knee  flexion and extension 4+/5 on LLE    FUNCTIONAL TESTS:  5 times sit to stand: 19.21s  GAIT: Distance walked: clinic distances Assistive device utilized: None Level of assistance: Complete Independence                                                                                                                               TREATMENT DATE:  05/31/23 Bike level 4 x 5 minutes Demonstration and discussion of the theragun and where and how to use Went over stretches for calves, HS, ITB, talked about quad and adductor stretches Showed and had her perform the feet  on ball exercises and the isometric abs Reveiwed and performed bridges and bridges with clamshells Also standing hip abduction and extension with tband strength Answered her questions regarding gym, yoga and stretches  05/04/23 NuStep L5x81mins  LAQ 3# 2x10 HS curls red 2x10 STS 2x10 Step ups 6" forward and lateral Calf raises 2x12 Calf stretch 15s x2  Resisted gait 20# 4 way x4   04/26/23 NuStep L4 L knee PROM with end range holds  Patellar mobs all directions Resisted Knee flex and ext. Green band x10 Sit to stand x2 with yellow ball Lateral band walks blue band x3 Step ups x10 each side Heel raises 2x10 Resisted 6in step up/ lateral step up x10 each HS stretch DF stretch Balance on airex weight shifts, reaching to targets  04/17/23 NuStep L5 x 6 min L Knee PROM w/ end range holds  Patellar mobs LLE LLE LAQ 3lb 2x10 LLE HS curls green 2x10 S2S x10, holding yellow ball x 10 6in step ups x10 each Lateral step ups 4in x 10 each Heel raises black 2x10 20lb resisted side steps x5 each Seated HS stretch   04/05/23- EVAL    PATIENT EDUCATION:  Education details: POC, HEP Person educated: Patient Education method: Explanation Education comprehension: verbalized understanding  HOME EXERCISE PROGRAM: Access Code: [email or web address] URL: [email or web address] Date: 04/05/2023 Prepared by: Donavon Fudge  Exercises - Sidelying Hip Abduction  - 1 x daily - 7 x weekly - 3 sets - 10 reps - Clamshell with Resistance  - 1 x daily - 7 x weekly - 3 sets - 10 reps - Supine Bridge with Resistance Band  - 1 x daily - 7 x weekly - 3 sets - 10 reps - Sit to Stand  - 1 x daily - 7 x weekly - 3 sets - 10 reps - Heel Raises with Counter Support  - 1 x daily - 7 x weekly - 3 sets - 10 reps  ASSESSMENT:  CLINICAL IMPRESSION: Patient reports that she is doing well, went over all HEP, stretches and massage as noted above, talked about mechanics of the knee and some ways to  avoid stress on the joint, she reports that she is feeling much better   Patient is a 63 y.o. 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  She reports her x-rays showed a lateral shift in her patella. Pt enters doing well, she reports improved mobility from HEP daily. We did Functional strengthening exercises to improve LE strength and stability. Some initial instability with resisted lateral step ups but regained stability with verbal cues. Tactiel cues for LE positioning with sit to stand. Good stability on non compliant surfaces. Patient will benefit from PT to work on strengthening around L knee to be able to increase activity level and return to walking 1-24miles a day without pain.   OBJECTIVE IMPAIRMENTS: Abnormal gait, decreased strength, and pain.   ACTIVITY LIMITATIONS: squatting, stairs, and locomotion level  PARTICIPATION LIMITATIONS: community activity and yard work  [facility] POTENTIAL: Excellent  CLINICAL DECISION MAKING: Stable/uncomplicated  EVALUATION COMPLEXITY: Low  GOALS: Goals reviewed with patient? Yes  SHORT TERM GOALS: Target date: 05/03/23  Patient will be independent with initial HEP. Baseline: given 04/05/23 Goal status: Met 04/17/23   LONG TERM GOALS: Target date: 05/31/23  Patient will be independent with advanced/ongoing HEP to improve outcomes and carryover.  Baseline:  Goal status: met 05/31/23  2.  Patient will report at least 50-75% improvement in L knee pain to improve QOL. (<2/10) Baseline: 4-5/10 Goal status: met 05/31/23  3.  Patient will demonstrate improved functional LE strength as demonstrated by 5xSTS <14s. Baseline: 19.21s Goal status: met 05/31/23  4.  Patient will be able to return to walking 1-2 miles a day without increased pain to access community.  Baseline: slowly getting back to walking but unable to go as far and has pain Goal status: met 05/31/23  5. Patient  will be able to ascend/descend stairs with 1 HR and reciprocal step pattern safely to access home and community.  Baseline: pain coming down stairs Goal status: met 05/31/23   PLAN:  PT FREQUENCY: 1x/week  PT DURATION: 8 weeks  PLANNED INTERVENTIONS: 97110-Therapeutic exercises, 97530- Therapeutic activity, 97112- Neuromuscular re-education, 97535- Self Care, 91478- Manual therapy, [phone]- Gait training, Patient/Family education, Balance training, Stair training, Taping, Joint mobilization, Spinal mobilization, Cryotherapy, and Moist heat  PLAN FOR NEXT SESSION: goals met will D/C  Hollis Lurie, PT 05/31/2023, 10:18 AM

## 2023-05-31 NOTE — ED Provider Notes (Signed)
 [place] [facility] AT [facility] Provider Note   CSN: [id number] Arrival date & time: 05/31/23  2237     History  Chief Complaint  Patient presents with   Laceration    Cheyenne Bullock is a 64 y.o. female with noncontributory past medical history presents with laceration to chin to her right index finger.  Cut it with a pizza cutter.  Denies any numbness or tingling.  Not on blood thinners.   Laceration     Past Medical History:  Diagnosis Date   Abdominal pain, generalized    Allergy    seasonal   Arthritis    Cancer (HCC) 11/12/2013   thyroid  removed   Hyperlipemia    Hypertension    Left thyroid  nodule    thyroid  removed     Home Medications Prior to Admission medications   Medication Sig Start Date End Date Taking? Authorizing Provider  atorvastatin (LIPITOR) 20 MG tablet Take 20 mg by mouth daily.    [provider]  calcium-vitamin D (OSCAL WITH D) 500-200 MG-UNIT per tablet Take 1 tablet by mouth daily.      [provider]  Cholecalciferol (VITAMIN D3 SUPER STRENGTH) 2000 UNITS TABS Take by mouth.    [provider]  dicyclomine  (BENTYL ) 20 MG tablet Take 1 tablet (20 mg total) by mouth 3 (three) times daily as needed. Patient not taking: Reported on 09/05/2019 01/21/11 01/21/12  Tabori, Katherine E, MD  levothyroxine (SYNTHROID, LEVOTHROID) 137 MCG tablet Take by mouth. 11/18/13   [provider]  losartan (COZAAR) 50 MG tablet Take 50 mg by mouth daily.    [provider]  multivitamin [facility]) per tablet Take 1 tablet by mouth daily.      [provider]  Omega-3 1000 MG CAPS Take by mouth.    [provider]  thyroid  (ARMOUR THYROID ) 90 MG tablet TAKE 1 TABLET BY MOUTH EVERY DAY WITH GLASS OF WATER. WAIT 1 HOUR BEFORE EATING, DRINKING OR TAKING OTHER MEDICATION 09/25/15   [provider]  vitamin B-12 (CYANOCOBALAMIN) 1000 MCG tablet Take 1,000 mcg by mouth daily.     [provider]      Allergies    Patient has no known allergies.    Review of Systems   Review of Systems  Skin:  Positive for wound.    Physical Exam Updated Vital Signs BP (!) 181/89 (BP Location: Right Arm)   Pulse 82   Temp 98.6 F (37 C) (Oral)   Resp 14   Ht 5\' 1"  (1.549 m)   Wt 63.5 kg   SpO2 98%   BMI 26.45 kg/m  Physical Exam Constitutional:      Appearance: Normal appearance.  HENT:     Head: Normocephalic and atraumatic.  Eyes:     Conjunctiva/sclera: Conjunctivae normal.  Cardiovascular:     Pulses: Normal pulses.  Musculoskeletal:     Comments: Very superficial laceration along the radial aspect of the right index finger, approximately half a centimeter in length.  Hemostasis is achieved, no significant swelling, NVI, cap refill less than 2 secs, capable of making a full fist, radial pulses 2+  Neurological:     Mental Status: She is alert.     ED Results / Procedures / Treatments   Labs (all labs ordered are listed, but only abnormal results are displayed) Labs Reviewed - No data to display  EKG None  Radiology No results found.  Procedures Procedures    Medications Ordered  in ED Medications  Tdap (BOOSTRIX) injection 0.5 mL (has no administration in time range)    ED Course/ Medical Decision Making/ A&P                                 Medical Decision Making  This patient presents to the ED with chief complaint(s) of laceration.  The complaint involves an extensive differential diagnosis and also carries with it a high risk of complications and morbidity.   Pertinent past medical history as listed in HPI  The differential diagnosis includes  Simple laceration versus tendon versus vascular injury, retained foreign body Additional history obtained: Records reviewed Care Everywhere/External Records  Assessment and management:   Patient presenting with laceration to her right index finger.  On exam there is a half a  centimeter superficial laceration along the radial aspect of the digit.  Hemostasis is achieved.  She is capable of making a full fist, NVI, no concern for tendon or vascular injury.  Does not appear to warrant sutures.  Will apply sterile dressing allow to heal by secondary intent.  Independent ECG interpretation:  none  Independent labs interpretation:  The following labs were independently interpreted:  none  Independent visualization and interpretation of imaging: I independently visualized the following imaging with scope of interpretation limited to determining acute life threatening conditions related to emergency care: none    Consultations obtained:   none  Disposition:   Patient will be discharged home. The patient has been appropriately medically screened and/or stabilized in the ED. I have low suspicion for any other emergent medical condition which would require further screening, evaluation or treatment in the ED or require inpatient management. At time of discharge the patient is hemodynamically stable and in no acute distress. I have discussed work-up results and diagnosis with patient and answered all questions. Patient is agreeable with discharge plan. We discussed strict return precautions for returning to the emergency department and they verbalized understanding.     Social Determinants of Health:   none  This note was dictated with voice recognition software.  Despite best efforts at proofreading, errors may have occurred which can change the documentation meaning.          Final Clinical Impression(s) / ED Diagnoses Final diagnoses:  Laceration of right hand without foreign body, initial encounter    Rx / DC Orders ED Discharge Orders     None         Stanton Earthly 05/31/23 2330    Mordecai Applebaum, MD 06/01/23 [phone]

## 2023-05-31 NOTE — ED Triage Notes (Signed)
 Pt POV steady gait- pt reports cutting R index finger with pizza cutter appx 2200 today.  Pt denies blood thinners, bleeding controlled at time of triage.

## 2023-05-31 NOTE — Discharge Instructions (Addendum)
 You were evaluated in the emergency room for a laceration.  Please change the dressing every day.  You may clean with soap and water.  If you experience any new or worsening symptoms including redness, swelling, worsening pain fevers or chills please return to the emergency room.
